# Patient Record
Sex: Male | Born: 1990 | Hispanic: Yes | Marital: Single | Smoking: Never smoker
Health system: Southern US, Community
[De-identification: ages and names within clinical notes are randomized; demographics above are authoritative.]

---

## 2012-12-04 ENCOUNTER — Emergency Department (HOSPITAL_COMMUNITY)
Admission: EM | Admit: 2012-12-04 | Discharge: 2012-12-05 | Disposition: A | Discharge status: Home / Self Care | Payer: Self-pay | Attending: Emergency Medicine | Admitting: Emergency Medicine

## 2012-12-04 ENCOUNTER — Encounter (HOSPITAL_COMMUNITY): Payer: Self-pay | Admitting: Emergency Medicine

## 2012-12-04 DIAGNOSIS — J111 Influenza due to unidentified influenza virus with other respiratory manifestations: Secondary | ICD-10-CM | POA: Insufficient documentation

## 2012-12-04 NOTE — ED Notes (Signed)
Patient presents with c/o fever, body aches  Able to drink and eat.

## 2012-12-05 MED ORDER — IBUPROFEN 800 MG PO TABS: 800.0000 mg | ORAL_TABLET | Freq: Three times a day (TID) | ORAL | Status: AC

## 2012-12-05 MED ORDER — IBUPROFEN 400 MG PO TABS
800.0000 mg | ORAL_TABLET | Freq: Once | ORAL | Status: AC
  Administered 2012-12-05: 800 mg via ORAL
  Filled 2012-12-05: qty 2

## 2012-12-05 MED ORDER — HYDROCOD POLST-CHLORPHEN POLST 10-8 MG/5ML PO LQCR: 5.0000 mL | Freq: Two times a day (BID) | ORAL | Status: AC

## 2012-12-05 NOTE — ED Provider Notes (Signed)
Medical screening examination/treatment/procedure(s) were performed by non-physician practitioner and as supervising physician I was immediately available for consultation/collaboration.    Sabina Beavers M Raji Glinski, MD 12/05/12 0623 

## 2012-12-05 NOTE — ED Provider Notes (Signed)
CSN: [id number]     Arrival date & time 12/04/12  2153 History   First MD Initiated Contact with Patient 12/04/12 2328     Chief Complaint  Patient presents with  . Fever  . Generalized Body Aches   HPI  History provided by the patient through a Spanish interpreter. Patient is a 22 year old Hispanic male with no significant PMH presenting with complaints of fever, body aches, chills, sore throat and eye pain. Symptoms have been present for the past one to 2 days. He began with general fatigue, fever and chills. Patient also began having worsening sore throat. He reports occasional cough symptoms. Today he has also had a pain and pressure around both eyes. Denies any redness, swelling or drainage. He does report some nasal congestion and rhinorrhea. Patient has not used any medications for treatment for his symptoms. He has slightly decreased appetite but did eat and drink normally today. Denies any associated vomiting or diarrhea. No recent travel or known sick contacts. No other aggravating or alleviating factors. No other associated symptoms.     History reviewed. No pertinent past medical history. History reviewed. No pertinent past surgical history. History reviewed. No pertinent family history. History  Substance Use Topics  . Smoking status: Never Smoker   . Smokeless tobacco: Never Used  . Alcohol Use: Yes     Comment: ocassionally    Review of Systems  Constitutional: Positive for fever, chills, diaphoresis, appetite change and fatigue.  HENT: Positive for rhinorrhea and sore throat. Negative for voice change.   Eyes: Positive for pain. Negative for photophobia, discharge, redness and visual disturbance.  Respiratory: Positive for cough. Negative for shortness of breath.   Cardiovascular: Negative for chest pain.  Gastrointestinal: Negative for nausea, vomiting, abdominal pain and diarrhea.  Skin: Negative for rash.  All other systems reviewed and are  negative.    Allergies  Review of patient's allergies indicates no known allergies.  Home Medications  No current outpatient prescriptions on file. BP 128/65  Pulse 92  Temp(Src) 100.2 F (37.9 C) (Oral)  Resp 18  Wt 128 lb 9 oz (58.316 kg)  SpO2 100% Physical Exam  Nursing note and vitals reviewed. Constitutional: He is oriented to person, place, and time. He appears well-developed and well-nourished. No distress.  HENT:  Head: Normocephalic.  Right Ear: Tympanic membrane normal.  Left Ear: Tympanic membrane normal.  Nose: Mucosal edema and rhinorrhea present. Right sinus exhibits maxillary sinus tenderness and frontal sinus tenderness. Left sinus exhibits maxillary sinus tenderness and frontal sinus tenderness.  Mouth/Throat: Oropharynx is clear and moist.  No significant erythema of the oropharynx. No lesions or petechiae. Normal tonsils. Uvula midline.  There is some pressure along the maxillary and frontal sinuses.  Eyes: Conjunctivae and EOM are normal. Pupils are equal, round, and reactive to light.  Neck: Normal range of motion. Neck supple.  No meningeal signs  Cardiovascular: Normal rate and regular rhythm.   Pulmonary/Chest: Effort normal and breath sounds normal. No respiratory distress. He has no wheezes. He has no rales.  Abdominal: Soft. There is no tenderness. There is no rebound and no guarding.  Musculoskeletal: Normal range of motion.  Neurological: He is alert and oriented to person, place, and time.  Skin: Skin is warm. No rash noted.  Psychiatric: He has a normal mood and affect. His behavior is normal.    ED Course  Procedures   DIAGNOSTIC STUDIES: Oxygen Saturation is 100% on room air.    COORDINATION OF CARE:  Nursing notes reviewed. Vital signs reviewed. Initial pt interview and examination performed.   2:08 AM-patient seen and evaluated. He is well-appearing in no acute distress. Does not appear severely ill or toxic. Does have slight fever  on triage. Symptoms are consistent with viral infection possible influenza. Patient did not receive flu vaccination. No recent travel. Discussed treatment plan with pt at bedside, which includes ibuprofen or Tylenol for fever and body aches as well as cough medication. Pt agrees with plan. Patient instructed to return in 5 days if symptoms not improved or to return sooner if symptoms worsen suddenly.      MDM   1. Influenza        Angus Seller, PA-C 12/05/12 [phone]

## 2013-11-13 ENCOUNTER — Emergency Department (HOSPITAL_COMMUNITY)
Admission: EM | Admit: 2013-11-13 | Discharge: 2013-11-13 | Disposition: A | Discharge status: Home / Self Care | Payer: Self-pay | Attending: Emergency Medicine | Admitting: Emergency Medicine

## 2013-11-13 ENCOUNTER — Encounter (HOSPITAL_COMMUNITY): Payer: Self-pay | Admitting: *Deleted

## 2013-11-13 DIAGNOSIS — L03011 Cellulitis of right finger: Secondary | ICD-10-CM

## 2013-11-13 DIAGNOSIS — Z791 Long term (current) use of non-steroidal anti-inflammatories (NSAID): Secondary | ICD-10-CM | POA: Insufficient documentation

## 2013-11-13 DIAGNOSIS — Z79899 Other long term (current) drug therapy: Secondary | ICD-10-CM | POA: Insufficient documentation

## 2013-11-13 DIAGNOSIS — L600 Ingrowing nail: Secondary | ICD-10-CM | POA: Insufficient documentation

## 2013-11-13 DIAGNOSIS — L03031 Cellulitis of right toe: Secondary | ICD-10-CM | POA: Insufficient documentation

## 2013-11-13 MED ORDER — HYDROCODONE-ACETAMINOPHEN 5-325 MG PO TABS: ORAL_TABLET | ORAL | Status: AC

## 2013-11-13 MED ORDER — LIDOCAINE HCL 2 % IJ SOLN
20.0000 mL | Freq: Once | INTRAMUSCULAR | Status: AC
  Administered 2013-11-13: 400 mg
  Filled 2013-11-13: qty 20

## 2013-11-13 MED ORDER — NAPROXEN 500 MG PO TABS: 500.0000 mg | ORAL_TABLET | Freq: Two times a day (BID) | ORAL | Status: AC

## 2013-11-13 MED ORDER — SULFAMETHOXAZOLE-TRIMETHOPRIM 800-160 MG PO TABS: 1.0000 | ORAL_TABLET | Freq: Two times a day (BID) | ORAL | Status: DC

## 2013-11-13 NOTE — ED Notes (Signed)
Pt in c/o infection to right great toe, swelling and redness noted, states it started as an ingrown toe nail

## 2013-11-13 NOTE — ED Provider Notes (Signed)
CSN: [id number]     Arrival date & time 11/13/13  2018 History   First MD Initiated Contact with Patient 11/13/13 2110     Chief Complaint  Patient presents with  . Wound Infection     (Consider location/radiation/quality/duration/timing/severity/associated sxs/prior Treatment) HPI Comments: Patient presents with complaint of swelling, drainage of pus, pain to the lateral aspect of his right great toenail for the past 2 weeks. No fever. Patient states that it hurts more to walk on the foot. He has been using peroxide but no other treatments prior to arrival.   The history is provided by the patient. A language interpreter was used.    History reviewed. No pertinent past medical history. History reviewed. No pertinent past surgical history. History reviewed. No pertinent family history. History  Substance Use Topics  . Smoking status: Never Smoker   . Smokeless tobacco: Never Used  . Alcohol Use: Yes     Comment: ocassionally    Review of Systems  Constitutional: Negative for fever.  Gastrointestinal: Negative for nausea and vomiting.  Skin: Positive for color change.       Positive for abscess  Hematological: Negative for adenopathy.   Allergies  Review of patient's allergies indicates no known allergies.  Home Medications   Prior to Admission medications   Medication Sig Start Date End Date Taking? Authorizing Provider  chlorpheniramine-HYDROcodone (TUSSIONEX PENNKINETIC ER) 10-8 MG/5ML LQCR Take 5 mLs by mouth every 12 (twelve) hours. Take 5 mLs by mouth every 12 (twelve) hours. 12/05/12   Phill MutterPeter S Dammen, PA-C  ibuprofen (ADVIL,MOTRIN) 800 MG tablet Take 1 tablet (800 mg total) by mouth 3 (three) times daily. 12/05/12   Peter S Dammen, PA-C   BP 122/66 mmHg  Pulse 74  Temp(Src) 98.2 F (36.8 C) (Oral)  Resp 16  Ht 5\' 8"  (1.727 m)  Wt 132 lb 9.6 oz (60.147 kg)  BMI 20.17 kg/m2  SpO2 98%   Physical Exam  Constitutional: He appears well-developed and  well-nourished.  HENT:  Head: Normocephalic and atraumatic.  Eyes: Conjunctivae are normal.  Neck: Normal range of motion. Neck supple.  Pulmonary/Chest: No respiratory distress.  Neurological: He is alert.  Skin: Skin is warm and dry.  There is swelling and pus draining from the right lateral nail fold with ingrown toenail and associated paronychia. There is no streaking onto the foot. Area is very tender to palpation and fluctuant.  Psychiatric: He has a normal mood and affect.  Nursing note and vitals reviewed.   ED Course  NAIL REMOVAL Date/Time: 11/13/2013 10:50 PM Performed by: Renne CriglerGEIPLE, Karlissa Aron Authorized by: Renne CriglerGEIPLE, Oriah Leinweber Consent: Verbal consent obtained. Risks and benefits: risks, benefits and alternatives were discussed Consent given by: patient Patient understanding: patient states understanding of the procedure being performed Patient consent: the patient's understanding of the procedure matches consent given Procedure consent: procedure consent matches procedure scheduled Required items: required blood products, implants, devices, and special equipment available Patient identity confirmed: verbally with patient and arm band Time out: Immediately prior to procedure a "time out" was called to verify the correct patient, procedure, equipment, support staff and site/side marked as required. Location: right foot Location details: right big toe Anesthesia: digital block Local anesthetic: lidocaine 1% without epinephrine Anesthetic total: 4 ml Patient sedated: no Preparation: skin prepped with alcohol and skin prepped with Betadine Amount removed: 1/4 Nail removed location: lateral. Wedge excision of skin of nail fold: no Nail bed sutured: no Nail matrix removed: partial Removed nail replaced and anchored: no  Dressing: antibiotic ointment and dressing applied Patient tolerance: Patient tolerated the procedure well with no immediate complications   (including critical  care time) Labs Review Labs Reviewed - No data to display  Imaging Review No results found.   EKG Interpretation None       Patient seen and examined. Procedure explained via interpreter phone. Patient informed of risks and benefits. He agrees to proceed.   Vital signs reviewed and are as follows: BP 117/61 mmHg  Pulse 61  Temp(Src) 98.2 F (36.8 C) (Oral)  Resp 20  Ht 5\' 8"  (1.727 m)  Wt 132 lb 9.6 oz (60.147 kg)  BMI 20.17 kg/m2  SpO2 100%  Digital block performed Technique: ring block Toe: Great right Area prepped with alcohol wipe and iodine Medication: 4mL of 1% lidocaine without epinephrine. Patient tolerated procedure well. Excellent anesthesia achieved.   Moderate amount of pus expressed upon removal of nail.   Patient asked to take antibiotics and pain medications as prescribed. He is encouraged to return to the emergency department in 48-72 hours for a wound recheck to ensure that the infection is improving. He is instructed to soak his foot in warm water for 15 minutes at least twice a day for the next 5 days. Work note given. Patient told to wear shoes that protect the area and to keep it clean.  Patient counseled on use of narcotic pain medications. Counseled not to combine these medications with others containing tylenol. Urged not to drink alcohol, drive, or perform any other activities that requires focus while taking these medications. The patient verbalizes understanding and agrees with the plan.   MDM   Final diagnoses:  Ingrown toenail  Paronychia, right   Patient with infected ingrown toenail. Infection was severe enough and nail was significantly ingrown enough to necessitate partial excision of nail. Patient tolerated procedure very well. Discussed appropriate wound care and return in 48 hours for recheck.         Renne CriglerJoshua Maya Scholer, PA-C 11/13/13 2355

## 2013-11-13 NOTE — Discharge Instructions (Signed)
Please read and follow all provided instructions.  Your diagnoses today include:  1. Ingrown toenail   2. Paronychia, right     Tests performed today include:  Vital signs. See below for your results today.   Medications prescribed:   Vicodin (hydrocodone/acetaminophen) - narcotic pain medication  DO NOT drive or perform any activities that require you to be awake and alert because this medicine can make you drowsy. BE VERY CAREFUL not to take multiple medicines containing Tylenol (also called acetaminophen). Doing so can lead to an overdose which can damage your liver and cause liver failure and possibly death.   Naproxen - anti-inflammatory pain medication  Do not exceed 500mg  naproxen every 12 hours, take with food  You have been prescribed an anti-inflammatory medication or NSAID. Take with food. Take smallest effective dose for the shortest duration needed for your pain. Stop taking if you experience stomach pain or vomiting.    Bactrim (trimethoprim/sulfamethoxazole) - antibiotic  You have been prescribed an antibiotic medicine: take the entire course of medicine even if you are feeling better. Stopping early can cause the antibiotic not to work.  Take any prescribed medications only as directed.   Home care instructions:   Follow any educational materials contained in this packet  Soak your foot in warm water twice a day for the next 5 days.   Follow-up instructions: Return to the Emergency Department in 48-72 hours for recheck.  Return instructions:  Return to the Emergency Department if you have:  Fever  Worsening symptoms  Worsening pain  Worsening swelling  Redness of the skin that moves away from the affected area, especially if it streaks away from the affected area   Any other emergent concerns   Your vital signs today were: BP 122/66 mmHg   Pulse 74   Temp(Src) 98.2 F (36.8 C) (Oral)   Resp 16   Ht 5\' 8"  (1.727 m)   Wt 132 lb 9.6 oz (60.147  kg)   BMI 20.17 kg/m2   SpO2 98% If your blood pressure (BP) was elevated above 135/85 this visit, please have this repeated by your doctor within one month. --------------

## 2013-11-13 NOTE — ED Notes (Signed)
Patient is alert and orientedx4.  Patient was explained discharge instructions and they understood them with no questions.  The patient's brother, Colin RheinGuadalupe Yanez is taking the patient home.

## 2016-10-31 ENCOUNTER — Encounter (HOSPITAL_COMMUNITY): Payer: Self-pay | Admitting: *Deleted

## 2016-10-31 ENCOUNTER — Emergency Department (HOSPITAL_COMMUNITY)
Admission: EM | Admit: 2016-10-31 | Discharge: 2016-10-31 | Disposition: A | Discharge status: Home / Self Care | Payer: Self-pay | Attending: Emergency Medicine | Admitting: Emergency Medicine

## 2016-10-31 DIAGNOSIS — Z791 Long term (current) use of non-steroidal anti-inflammatories (NSAID): Secondary | ICD-10-CM | POA: Insufficient documentation

## 2016-10-31 DIAGNOSIS — Z79899 Other long term (current) drug therapy: Secondary | ICD-10-CM | POA: Insufficient documentation

## 2016-10-31 DIAGNOSIS — L02415 Cutaneous abscess of right lower limb: Secondary | ICD-10-CM | POA: Insufficient documentation

## 2016-10-31 MED ORDER — SULFAMETHOXAZOLE-TRIMETHOPRIM 800-160 MG PO TABS: 1.0000 | ORAL_TABLET | Freq: Two times a day (BID) | ORAL | 0 refills | Status: AC

## 2016-10-31 MED ORDER — CEPHALEXIN 500 MG PO CAPS: 500.0000 mg | ORAL_CAPSULE | Freq: Four times a day (QID) | ORAL | 0 refills | Status: AC

## 2016-10-31 MED ORDER — LIDOCAINE-EPINEPHRINE (PF) 2 %-1:200000 IJ SOLN
10.0000 mL | Freq: Once | INTRAMUSCULAR | Status: AC
  Administered 2016-10-31: 10 mL via INTRADERMAL
  Filled 2016-10-31: qty 20

## 2016-10-31 NOTE — ED Provider Notes (Signed)
[facility]   CSN: [id number] Arrival date & time: 10/31/16  1732     History   Chief Complaint Chief Complaint  Patient presents with  . Abscess    HPI David Delacruz is a 26 y.o. male.  HPI David Delacruz is a 26 y.o. male presents to emergency department complaining of an abscess to the right lower leg. Patient states he noticed a bump on his leg 3 days ago. Since then the bump has gotten larger and more painful. He states now the pain radiates all the way from the ankle to his knee. He states he has a wound that is draining yellow drainage. It is tender to palpation. He reports he has been applying peroxide to the wound with no improvement. He denies any fever or chills. She denies any injuries. He is not sure if this was an injury at work, or if it's an insect bite. No history of the same.  Spanish interpreter was used via [employer] ipad  History reviewed. No pertinent past medical history.  There are no active problems to display for this patient.   History reviewed. No pertinent surgical history.     Home Medications    Prior to Admission medications   Medication Sig Start Date End Date Taking? Authorizing Provider  chlorpheniramine-HYDROcodone (TUSSIONEX PENNKINETIC ER) 10-8 MG/5ML LQCR Take 5 mLs by mouth every 12 (twelve) hours. Take 5 mLs by mouth every 12 (twelve) hours. 12/05/12   Ivonne Andrewammen, Peter, PA-C  HYDROcodone-acetaminophen (NORCO/VICODIN) 5-325 MG per tablet Take 1-2 tablets every 6 hours as needed for severe pain 11/13/13   Renne CriglerGeiple, Joshua, PA-C  ibuprofen (ADVIL,MOTRIN) 800 MG tablet Take 1 tablet (800 mg total) by mouth 3 (three) times daily. 12/05/12   Ivonne Andrewammen, Peter, PA-C  naproxen (NAPROSYN) 500 MG tablet Take 1 tablet (500 mg total) by mouth 2 (two) times daily. 11/13/13   Renne CriglerGeiple, Joshua, PA-C  sulfamethoxazole-trimethoprim (SEPTRA DS) 800-160 MG per tablet Take 1 tablet by mouth  every 12 (twelve) hours. 11/13/13   Renne CriglerGeiple, Joshua, PA-C    Family History No family history on file.  Social History Social History  Substance Use Topics  . Smoking status: Never Smoker  . Smokeless tobacco: Never Used  . Alcohol use Yes     Comment: ocassionally     Allergies   Patient has no known allergies.   Review of Systems Review of Systems  Constitutional: Negative for chills and fever.  Respiratory: Negative for cough, chest tightness and shortness of breath.   Cardiovascular: Negative for chest pain, palpitations and leg swelling.  Gastrointestinal: Negative for abdominal distention, abdominal pain, diarrhea, nausea and vomiting.  Musculoskeletal: Positive for myalgias. Negative for neck pain and neck stiffness.  Skin: Positive for color change and wound. Negative for rash.  Allergic/Immunologic: Negative for immunocompromised state.  Neurological: Negative for dizziness, weakness, light-headedness, numbness and headaches.     Physical Exam Updated Vital Signs BP (!) 153/87 (BP Location: Right Arm)   Pulse 88   Temp 98.4 F (36.9 C) (Oral)   Resp 18   Ht 5\' 8"  (1.727 m)   Wt 67.1 kg (148 lb)   SpO2 100%   BMI 22.50 kg/m   Physical Exam  Constitutional: He appears well-developed and well-nourished. No distress.  Eyes: Conjunctivae are normal.  Neck: Neck supple.  Cardiovascular: Normal rate.   Pulmonary/Chest: No respiratory distress.  Abdominal: He exhibits no distension.  Musculoskeletal:  Erythema to the  right anterior shin with mid central abscess measuring about 3 x 3 cm. There are central scabbing with small purulent drainage. Tender to palpation. Fluctuant. Normal ankle and foot with no swelling, erythema. Dorsal pedal pulses intact. Full range of motion of both ankle and knee joints.  Skin: Skin is warm and dry.  Nursing note and vitals reviewed.    ED Treatments / Results  Labs (all labs ordered are listed, but only abnormal results are  displayed) Labs Reviewed - No data to display  EKG  EKG Interpretation None       Radiology No results found.  Procedures .Marland KitchenIncision and Drainage Date/Time: 10/31/2016 7:15 PM Performed by: Jaynie Crumble Authorized by: Jaynie Crumble   Consent:    Consent obtained:  Verbal   Consent given by:  Patient   Risks discussed:  Bleeding, incomplete drainage and pain   Alternatives discussed:  No treatment Location:    Type:  Abscess   Location:  Lower extremity   Lower extremity location:  Leg   Leg location:  R lower leg Pre-procedure details:    Skin preparation:  Betadine Anesthesia (see MAR for exact dosages):    Anesthesia method:  Local infiltration   Local anesthetic:  Lidocaine 2% WITH epi Procedure type:    Complexity:  Simple Procedure details:    Incision types:  Single straight   Scalpel blade:  11   Wound management:  Probed and deloculated and irrigated with saline   Drainage:  Bloody and purulent   Drainage amount:  Moderate   Packing materials:  1/4 in gauze Post-procedure details:    Patient tolerance of procedure:  Tolerated well, no immediate complications   (including critical care time)  Medications Ordered in ED Medications  lidocaine-EPINEPHrine (XYLOCAINE W/EPI) 2 %-1:200000 (PF) injection 10 mL (not administered)     Initial Impression / Assessment and Plan / ED Course  I have reviewed the triage vital signs and the nursing notes.  Pertinent labs & imaging results that were available during my care of the patient were reviewed by me and considered in my medical decision making (see chart for details).     Pt with abscess to right lower leg. Incised and drained with no complications. Afebrile, non toxic. Warm compresses at home. Keflex, bactrim. Packing removal in 2 days. Follow up with pcp or return if worsening.   Vitals:   10/31/16 1738 10/31/16 1750  BP:  (!) 153/87  Pulse:  88  Resp:  18  Temp:  98.4 F (36.9 C)    TempSrc:  Oral  SpO2:  100%  Weight: 67.1 kg (148 lb)   Height: 5\' 8"  (1.727 m)      Final Clinical Impressions(s) / ED Diagnoses   Final diagnoses:  Abscess of right lower leg    New Prescriptions New Prescriptions   No medications on file     Jaynie Crumble, Cordelia Poche 10/31/16 1918    Shon Baton, MD 10/31/16 1931

## 2016-10-31 NOTE — Discharge Instructions (Signed)
Warm compresses several times a day. Ibuprofen/tylenol for pain. Take keflex and bactrim as prescribed for infection until all gone. Remove packing in two days after soaking in. Return if not improving or worsening.

## 2016-10-31 NOTE — ED Triage Notes (Addendum)
Pt states infected bug bite to RLE x 1 week.  He is unsure if something bit him or if he was "poked" by something while at work.

## 2018-03-13 ENCOUNTER — Emergency Department (HOSPITAL_COMMUNITY)
Admission: EM | Admit: 2018-03-13 | Discharge: 2018-03-13 | Disposition: A | Discharge status: Home / Self Care | Payer: Self-pay | Attending: Emergency Medicine | Admitting: Emergency Medicine

## 2018-03-13 ENCOUNTER — Other Ambulatory Visit: Payer: Self-pay

## 2018-03-13 ENCOUNTER — Encounter (HOSPITAL_COMMUNITY): Payer: Self-pay | Admitting: Emergency Medicine

## 2018-03-13 DIAGNOSIS — M79642 Pain in left hand: Secondary | ICD-10-CM | POA: Insufficient documentation

## 2018-03-13 DIAGNOSIS — S52502D Unspecified fracture of the lower end of left radius, subsequent encounter for closed fracture with routine healing: Secondary | ICD-10-CM | POA: Insufficient documentation

## 2018-03-13 DIAGNOSIS — W19XXXD Unspecified fall, subsequent encounter: Secondary | ICD-10-CM | POA: Insufficient documentation

## 2018-03-13 MED ORDER — KETOROLAC TROMETHAMINE 15 MG/ML IJ SOLN
15.0000 mg | Freq: Once | INTRAMUSCULAR | Status: DC
  Filled 2018-03-13: qty 1

## 2018-03-13 MED ORDER — KETOROLAC TROMETHAMINE 60 MG/2ML IM SOLN
15.0000 mg | Freq: Once | INTRAMUSCULAR | Status: AC
  Administered 2018-03-13: 15 mg via INTRAMUSCULAR
  Filled 2018-03-13: qty 2

## 2018-03-13 NOTE — ED Notes (Signed)
Patient verbalizes understanding of discharge instructions. Opportunity for questioning and answers were provided. Armband removed by staff, pt discharged from ED ambulatory.   

## 2018-03-13 NOTE — ED Triage Notes (Signed)
Pt. Stated, My hand is a different color. On Friday he was working and fell and hurt my left arm. And bandage it and said any problem to come here.

## 2018-03-13 NOTE — ED Provider Notes (Signed)
[facility] EMERGENCY DEPARTMENT Provider Note   CSN: [id number] Arrival date & time: 03/13/18  1716    History   Chief Complaint Chief Complaint  Patient presents with  . Hand Pain    HPI David Delacruz is a 28 y.o. male.     Patient is a 28 year old male with past medical history of distal radius fracture on 28 February of this year.  He was seen at [facility] and had the fracture splinted by orthopedics.  He was told to follow-up with orthopedics.  He comes in today concerned for compartment syndrome.  He reports that his left arm splint feels like it might be too tight.  He also notes that he does have purple discoloration of his left thumb.  His pain is otherwise been pretty much controlled with Tylenol and Motrin.     History reviewed. No pertinent past medical history.  There are no active problems to display for this patient.   History reviewed. No pertinent surgical history.      Home Medications    Prior to Admission medications   Medication Sig Start Date End Date Taking? Authorizing Provider  cephALEXin (KEFLEX) 500 MG capsule Take 1 capsule (500 mg total) by mouth 4 (four) times daily. 10/31/16   Kirichenko, Lemont Fillers, PA-C  chlorpheniramine-HYDROcodone (TUSSIONEX PENNKINETIC ER) 10-8 MG/5ML LQCR Take 5 mLs by mouth every 12 (twelve) hours. Take 5 mLs by mouth every 12 (twelve) hours. 12/05/12   Ivonne Andrew, PA-C  HYDROcodone-acetaminophen (NORCO/VICODIN) 5-325 MG per tablet Take 1-2 tablets every 6 hours as needed for severe pain 11/13/13   Renne Crigler, PA-C  ibuprofen (ADVIL,MOTRIN) 800 MG tablet Take 1 tablet (800 mg total) by mouth 3 (three) times daily. 12/05/12   Ivonne Andrew, PA-C  naproxen (NAPROSYN) 500 MG tablet Take 1 tablet (500 mg total) by mouth 2 (two) times daily. 11/13/13   Renne Crigler, PA-C    Family History No family history on file.  Social History Social History   Tobacco Use  . Smoking  status: Never Smoker  . Smokeless tobacco: Never Used  Substance Use Topics  . Alcohol use: Yes    Comment: ocassionally  . Drug use: No     Allergies   Patient has no known allergies.   Review of Systems Review of Systems  Constitutional: Negative for fever.  Musculoskeletal: Positive for arthralgias. Negative for back pain, gait problem, joint swelling, myalgias, neck pain and neck stiffness.  Skin: Positive for color change. Negative for pallor, rash and wound.  Neurological: Negative for dizziness and numbness.  Hematological: Does not bruise/bleed easily.     Physical Exam Updated Vital Signs BP 115/75 (BP Location: Right Arm)   Pulse 71   Temp 99 F (37.2 C) (Oral)   Resp 14   SpO2 100%   Physical Exam Vitals signs and nursing note reviewed.  Constitutional:      Appearance: Normal appearance.  HENT:     Head: Normocephalic.  Eyes:     Conjunctiva/sclera: Conjunctivae normal.  Pulmonary:     Effort: Pulmonary effort is normal.  Musculoskeletal:     Comments: The patient is in a splint with Ace wrap.  The patient has normal capillary reflex and normal sensation of all fingers.  He does have some ecchymosis to the lateral left thumb.  Patient states that this ecchymosis is what he was concerned about.  Reports that it feels kind of tight around his thumb.  He has full  range of motion of all of his fingers.  Skin:    General: Skin is warm and dry.     Capillary Refill: Capillary refill takes less than 2 seconds.  Neurological:     General: No focal deficit present.     Mental Status: He is alert.     Sensory: No sensory deficit.  Psychiatric:        Mood and Affect: Mood normal.      ED Treatments / Results  Labs (all labs ordered are listed, but only abnormal results are displayed) Labs Reviewed - No data to display  EKG None  Radiology No results found.  Procedures Procedures (including critical care time)  Medications Ordered in  ED Medications  ketorolac (TORADOL) injection 15 mg (15 mg Intramuscular Given 03/13/18 1936)     Initial Impression / Assessment and Plan / ED Course  I have reviewed the triage vital signs and the nursing notes.  Pertinent labs & imaging results that were available during my care of the patient were reviewed by me and considered in my medical decision making (see chart for details).  Clinical Course as of Mar 13 2123  Mon Mar 13, 2018  1925 No signs of compartment syndrome on exam.  The patient's elastic wrap was adjusted and patient had relief of his symptoms.  I gave him a shot of Toradol for his pain.  He has all the information for the orthopedic specialist who he is to follow-up with.  He had no further questions or complaints.  Discharge stable.  He was instructed on reasons to come back and other signs of compartment syndrome   [KM]    Clinical Course User Index [KM] Arlyn Dunning, PA-C       Based on review of vitals, medical screening exam, lab work and/or imaging, there does not appear to be an acute, emergent etiology for the patient's symptoms. Counseled pt on good return precautions and encouraged both PCP and ED follow-up as needed.   Clinical Impression: 1. Left hand pain     Disposition: Discharge   This note was prepared with assistance of Dragon voice recognition software. Occasional wrong-word or sound-a-like substitutions may have occurred due to the inherent limitations of voice recognition software.   Final Clinical Impressions(s) / ED Diagnoses   Final diagnoses:  Left hand pain    ED Discharge Orders    None       Jeral Pinch 03/13/18 2125    Gerhard Munch, MD 03/14/18 1956

## 2018-03-13 NOTE — ED Triage Notes (Signed)
Pt. Stated, I feel its too tight.

## 2018-03-13 NOTE — Discharge Instructions (Addendum)
Follow-up with orthopedics as you were referred at [facility].  Continue to take Tylenol and or Motrin for pain.  Return if you have any of the symptoms that we discussed about including cold fingers or numbness in your fingers.

## 2019-03-07 ENCOUNTER — Other Ambulatory Visit: Payer: Self-pay

## 2019-03-07 ENCOUNTER — Emergency Department (HOSPITAL_COMMUNITY): Payer: Self-pay

## 2019-03-07 ENCOUNTER — Encounter (HOSPITAL_COMMUNITY): Payer: Self-pay

## 2019-03-07 ENCOUNTER — Emergency Department (HOSPITAL_COMMUNITY)
Admission: EM | Admit: 2019-03-07 | Discharge: 2019-03-07 | Disposition: A | Discharge status: Home / Self Care | Payer: Self-pay | Attending: Emergency Medicine | Admitting: Emergency Medicine

## 2019-03-07 ENCOUNTER — Emergency Department (HOSPITAL_BASED_OUTPATIENT_CLINIC_OR_DEPARTMENT_OTHER): Payer: Self-pay

## 2019-03-07 DIAGNOSIS — M79609 Pain in unspecified limb: Secondary | ICD-10-CM

## 2019-03-07 DIAGNOSIS — M79652 Pain in left thigh: Secondary | ICD-10-CM | POA: Insufficient documentation

## 2019-03-07 DIAGNOSIS — R591 Generalized enlarged lymph nodes: Secondary | ICD-10-CM | POA: Insufficient documentation

## 2019-03-07 DIAGNOSIS — M79605 Pain in left leg: Secondary | ICD-10-CM

## 2019-03-07 LAB — CBC WITH DIFFERENTIAL/PLATELET
Abs Immature Granulocytes: 0.03 10*3/uL (ref 0.00–0.07)
Basophils Absolute: 0 10*3/uL (ref 0.0–0.1)
Basophils Relative: 0 %
Eosinophils Absolute: 0.1 10*3/uL (ref 0.0–0.5)
Eosinophils Relative: 1 %
HCT: 46 % (ref 39.0–52.0)
Hemoglobin: 15.5 g/dL (ref 13.0–17.0)
Immature Granulocytes: 0 %
Lymphocytes Relative: 19 %
Lymphs Abs: 2 10*3/uL (ref 0.7–4.0)
MCH: 29.9 pg (ref 26.0–34.0)
MCHC: 33.7 g/dL (ref 30.0–36.0)
MCV: 88.8 fL (ref 80.0–100.0)
Monocytes Absolute: 0.8 10*3/uL (ref 0.1–1.0)
Monocytes Relative: 8 %
Neutro Abs: 7.6 10*3/uL (ref 1.7–7.7)
Neutrophils Relative %: 72 %
Platelets: 247 10*3/uL (ref 150–400)
RBC: 5.18 MIL/uL (ref 4.22–5.81)
RDW: 12.5 % (ref 11.5–15.5)
WBC: 10.4 10*3/uL (ref 4.0–10.5)
nRBC: 0 % (ref 0.0–0.2)

## 2019-03-07 LAB — BASIC METABOLIC PANEL
Anion gap: 9 (ref 5–15)
BUN: 11 mg/dL (ref 6–20)
CO2: 25 mmol/L (ref 22–32)
Calcium: 9.1 mg/dL (ref 8.9–10.3)
Chloride: 105 mmol/L (ref 98–111)
Creatinine, Ser: 0.78 mg/dL (ref 0.61–1.24)
GFR calc Af Amer: >60 mL/min (ref >60)
GFR calc non Af Amer: >60 mL/min (ref >60)
Glucose, Bld: 101 mg/dL — ABNORMAL HIGH (ref 70–99)
Potassium: 4 mmol/L (ref 3.5–5.1)
Sodium: 139 mmol/L (ref 135–145)

## 2019-03-07 MED ORDER — IOHEXOL 350 MG/ML SOLN
100.0000 mL | Freq: Once | INTRAVENOUS | Status: AC | PRN
  Administered 2019-03-07: 17:00:00 100 mL via INTRAVENOUS

## 2019-03-07 MED ORDER — DICLOFENAC SODIUM 1 % EX GEL: 2.0000 g | Freq: Four times a day (QID) | CUTANEOUS | 0 refills | Status: AC

## 2019-03-07 NOTE — ED Notes (Signed)
Pt transported to CT ?

## 2019-03-07 NOTE — ED Notes (Signed)
Patient verbalizes understanding of discharge instructions. Opportunity for questioning and answers were provided. Armband removed by staff, pt discharged from ED.  

## 2019-03-07 NOTE — ED Triage Notes (Signed)
Spanish interpreter used for triage:  Pt reports pain to left thigh area, describes as a "ball". Area is not red but very tender to touch, no open wound or swelling.

## 2019-03-07 NOTE — Progress Notes (Signed)
Lower extremity venous has been completed.   Preliminary results in CV Proc.   Blanch Media 03/07/2019 11:57 AM

## 2019-03-07 NOTE — ED Notes (Signed)
Pt returned from CT °

## 2019-03-07 NOTE — Discharge Instructions (Addendum)
Toma ibuprofen 3 veces dada dia por dolor. Puede toma tylenol tambien si necesita.  [place] la crema por dolor.  Da [place] Dear cita con la clinica debajo si no esta emjorando Regresa a la sala de Associate Professor so tiene fiebre, si el piel es ronjo, o con nuevos sintomas.

## 2019-03-07 NOTE — ED Provider Notes (Signed)
[facility] EMERGENCY DEPARTMENT Provider Note   CSN: [id number] Arrival date & time: 03/07/19  1035     History Chief Complaint  Patient presents with   Leg Pain    David Delacruz is a 29 y.o. male presenting for evaluation of left leg pain.  Patient states the past 2 days, he has been having left anterior leg pain.  Pain is of the proximal thigh.  He describes it as feeling as a sharp ball which is constant, worse with palpation and movement.  He took Tylenol last night, which mild improved his symptoms.  He denies fall, trauma, or injury.  He denies increased activity.  He denies recent travel, surgeries, immobilization.  He denies hormone use.  He denies pain on the right side.  No fevers, chills, nausea, vomiting.  He denies history of similar.  Takes no medications daily.  HPI     History reviewed. No pertinent past medical history.  There are no problems to display for this patient.   History reviewed. No pertinent surgical history.     No family history on file.  Social History   Tobacco Use   Smoking status: Never Smoker   Smokeless tobacco: Never Used  Substance Use Topics   Alcohol use: Yes    Comment: ocassionally   Drug use: No    Home Medications Prior to Admission medications   Medication Sig Start Date End Date Taking? Authorizing Provider  acetaminophen (TYLENOL) 500 MG tablet Take 500 mg by mouth every 6 (six) hours as needed for mild pain.   Yes [provider]  Dextran 70-Hypromellose (ARTIFICIAL TEARS PF OP) Place 1 drop into both eyes daily.   Yes [provider]  cephALEXin (KEFLEX) 500 MG capsule Take 1 capsule (500 mg total) by mouth 4 (four) times daily. Patient not taking: Reported on 03/07/2019 10/31/16   Jeannett Senior, PA-C  chlorpheniramine-HYDROcodone (TUSSIONEX PENNKINETIC ER) 10-8 MG/5ML LQCR Take 5 mLs by mouth every 12 (twelve) hours. Take 5 mLs by mouth every 12 (twelve)  hours. Patient not taking: Reported on 03/07/2019 12/05/12   Hazel Sams, PA-C  diclofenac Sodium (VOLTAREN) 1 % GEL Apply 2 g topically 4 (four) times daily. 03/07/19   Frederic Tones, PA-C  HYDROcodone-acetaminophen (NORCO/VICODIN) 5-325 MG per tablet Take 1-2 tablets every 6 hours as needed for severe pain Patient not taking: Reported on 03/07/2019 11/13/13   Carlisle Cater, PA-C  ibuprofen (ADVIL,MOTRIN) 800 MG tablet Take 1 tablet (800 mg total) by mouth 3 (three) times daily. Patient not taking: Reported on 03/07/2019 12/05/12   Hazel Sams, PA-C  naproxen (NAPROSYN) 500 MG tablet Take 1 tablet (500 mg total) by mouth 2 (two) times daily. Patient not taking: Reported on 03/07/2019 11/13/13   Carlisle Cater, PA-C    Allergies    Patient has no known allergies.  Review of Systems   Review of Systems  Musculoskeletal: Positive for myalgias (L anterior proximal thigh).  Neurological: Negative for numbness.    Physical Exam Updated Vital Signs BP 119/79 (BP Location: Right Arm)    Pulse 85    Temp (!) 97.3 F (36.3 C) (Oral)    Resp 16    Ht 5' 6.14" (1.68 m)    Wt 73 kg    SpO2 100%    BMI 25.86 kg/m   Physical Exam Vitals and nursing note reviewed.  Constitutional:      General: He is not in acute distress.    Appearance: He is  well-developed.     Comments: Resting in the bed in no acute distress  HENT:     Head: Normocephalic and atraumatic.  Cardiovascular:     Rate and Rhythm: Normal rate and regular rhythm.     Pulses: Normal pulses.  Pulmonary:     Effort: Pulmonary effort is normal.     Breath sounds: Normal breath sounds.  Abdominal:     General: There is no distension.  Musculoskeletal:     Cervical back: Normal range of motion.       Legs:     Comments: Tenderness palpation of the anterior proximal left thigh.  No erythema, warmth, fluctuance, or induration.  No tenderness palpation over the bony hip/pelvis.  No tenderness palpation of the distal thigh or  calf.  Pedal pulse 2+ bilaterally.  Good distal sensation.  Full active range of motion of the toes, ankle, knees without difficulty.  Increased pain with flexion at the hip.  No pain of the right lower extremity.  Skin:    General: Skin is warm.     Capillary Refill: Capillary refill takes less than 2 seconds.     Findings: No rash.  Neurological:     Mental Status: He is alert and oriented to person, place, and time.     ED Results / Procedures / Treatments   Labs (all labs ordered are listed, but only abnormal results are displayed) Labs Reviewed  BASIC METABOLIC PANEL - Abnormal; Notable for the following components:      Result Value   Glucose, Bld 101 (*)    All other components within normal limits  CBC WITH DIFFERENTIAL/PLATELET    EKG None  Radiology CT ANGIO LOW EXTREM LEFT W &/OR WO CONTRAST  Result Date: 03/07/2019 CLINICAL DATA:  Left thigh pain. EXAM: CT ANGIOGRAPHY OF ABDOMINAL AORTA WITH ILIOFEMORAL RUNOFF TECHNIQUE: Multidetector CT imaging of the abdomen, pelvis and lower extremities was performed using the standard protocol during bolus administration of intravenous contrast. Multiplanar CT image reconstructions and MIPs were obtained to evaluate the vascular anatomy. CONTRAST:  OMNIPAQUE IOHEXOL 350 MG/ML SOLN COMPARISON:  None. FINDINGS: LEFT Lower Extremity Inflow: Common, internal and external iliac arteries are patent without evidence of aneurysm, dissection, vasculitis or significant stenosis. Outflow: Common, superficial and profunda femoral arteries and the popliteal artery are patent without evidence of aneurysm, dissection, vasculitis or significant stenosis. Runoff: There is likely a 3 vessel runoff, however there is suboptimal opacification of the tibial vasculature at the level of the ankle. This is felt to be secondary to contrast bolus timing as the vasculature at the level of the right ankle appears similar. Veins: No obvious venous abnormality  within the limitations of this arterial phase study. Review of the MIP images confirms the above findings. NON-VASCULAR The visualized portions of the patient's abdomen and pelvis are unremarkable. Bladder is unremarkable. There is no significant free fluid the patient's pelvis. There is an enlarged right inguinal lymph node measuring approximately 1.5 cm in the short axis (axial series 7, image 95). This lymph node appears to demonstrate a persistent normal fatty hilum by CT standards. IMPRESSION: 1. No acute vascular abnormality detected. There is likely a three vessel runoff to the left ankle, however there is suboptimal opacification of the distal AT, PT, and peroneal arteries. This is felt to be secondary to suboptimal contrast bolus timing. 2. Enlarged left inguinal lymph node of unknown clinical significance. Correlation for lower extremity infectious process is recommended. No such process is clearly  visualized on this CT. Electronically Signed   By: Katherine Mantle M.D.   On: 03/07/2019 18:17   VAS [place] LOWER EXTREMITY VENOUS (DVT) (MC and WL 7a-7p)  Result Date: 03/07/2019  Lower Venous DVTStudy Indications: Pain.  Comparison Study: no prior Performing Technologist: Blanch Media RVS  Examination Guidelines: A complete evaluation includes B-mode imaging, spectral Doppler, color Doppler, and power Doppler as needed of all accessible portions of each vessel. Bilateral testing is considered an integral part of a complete examination. Limited examinations for reoccurring indications may be performed as noted. The reflux portion of the exam is performed with the patient in reverse Trendelenburg.  +-----+---------------+---------+-----------+----------+--------------+  RIGHT Compressibility Phasicity Spontaneity Properties Thrombus Aging  +-----+---------------+---------+-----------+----------+--------------+  CFV   Full            Yes       Yes                                     +-----+---------------+---------+-----------+----------+--------------+   +---------+---------------+---------+-----------+----------+--------------+  LEFT      Compressibility Phasicity Spontaneity Properties Thrombus Aging  +---------+---------------+---------+-----------+----------+--------------+  CFV       Full            Yes       Yes                                    +---------+---------------+---------+-----------+----------+--------------+  SFJ       Full                                                             +---------+---------------+---------+-----------+----------+--------------+  FV Prox   Full                                                             +---------+---------------+---------+-----------+----------+--------------+  FV Mid    Full                                                             +---------+---------------+---------+-----------+----------+--------------+  FV Distal Full                                                             +---------+---------------+---------+-----------+----------+--------------+  PFV       Full                                                             +---------+---------------+---------+-----------+----------+--------------+  POP       Full            Yes       Yes                                    +---------+---------------+---------+-----------+----------+--------------+  PTV       Full                                                             +---------+---------------+---------+-----------+----------+--------------+  PERO      Full                                                             +---------+---------------+---------+-----------+----------+--------------+     Summary: RIGHT: - No evidence of common femoral vein obstruction.  LEFT: - There is no evidence of deep vein thrombosis in the lower extremity.  - No cystic structure found in the popliteal fossa. - Vascularized structure meauring 3.23x1.82cm. Etiology unknow.  *See table(s)  above for measurements and observations.    Preliminary     Procedures Procedures (including critical care time)  Medications Ordered in ED Medications  iohexol (OMNIPAQUE) 350 MG/ML injection 100 mL (100 mLs Intravenous Contrast Given 03/07/19 1723)    ED Course  I have reviewed the triage vital signs and the nursing notes.  Pertinent labs & imaging results that were available during my care of the patient were reviewed by me and considered in my medical decision making (see chart for details).    MDM Rules/Calculators/A&P                      Presenting for evaluation of anterior left proximal thigh pain.  Physical exam shows patient appears nontoxic.  Exam is not consistent with infection.  No bony tenderness, doubt fracture, AVN, or bony lesion.  Patient without risk factors for DVT, however with severe unilateral leg pain near the groin, will obtain ultrasound to rule out clot.  If negative, likely MSK and can be treated symptomatically.  Ultrasound shows abnormal area vascularization.  With attending, will obtain labs and CTA for further evaluation.  Labs interpreted by me, overall reassuring.  No leukocytosis (consistent with exam that shows no infection).   CTA without vascular abnormality.  Does show an enlarged lymph node in the inguinal region.  There are no signs of infection on exam or with patient's blood work.  Pain is mostly in the musculature.  As such, low suspicion for infectious cause for enlarged lymph node.  Discussed findings with patient.  Discussed treatment with anti-inflammatories and close monitoring.  Discussed importance of follow-up with primary care if symptoms are not improving.  At this time, patient appears safe for discharge.  Return precautions given.  Patient states he understands and agrees to plan.  Final Clinical Impression(s) / ED Diagnoses Final diagnoses:  Anterior leg pain, left  Lymphadenopathy    Rx / DC Orders ED Discharge Orders          Ordered  diclofenac Sodium (VOLTAREN) 1 % GEL  4 times daily     03/07/19 1828           Alveria Apley, PA-C 03/07/19 1840    Benjiman Core, MD 03/08/19 [phone]

## 2019-03-08 ENCOUNTER — Other Ambulatory Visit: Payer: Self-pay

## 2019-03-08 ENCOUNTER — Ambulatory Visit: Payer: Self-pay | Attending: Physical Medicine and Rehabilitation

## 2019-03-08 ENCOUNTER — Telehealth: Payer: Self-pay

## 2019-03-08 DIAGNOSIS — R42 Dizziness and giddiness: Secondary | ICD-10-CM | POA: Insufficient documentation

## 2019-03-08 DIAGNOSIS — R2689 Other abnormalities of gait and mobility: Secondary | ICD-10-CM | POA: Insufficient documentation

## 2019-03-08 DIAGNOSIS — H8112 Benign paroxysmal vertigo, left ear: Secondary | ICD-10-CM | POA: Insufficient documentation

## 2019-03-08 NOTE — Therapy (Signed)
[facility] [address] [place], [place], [address] Phone: [phone]   Fax:  [phone]  Physical Therapy Evaluation  Patient Details  Name: David Delacruz MRN: [id number] Date of Birth: 10-Feb-1990 Referring Provider (PT): Dr. Hughie Closs   Encounter Date: 03/08/2019  PT End of Session - 03/08/19 1812    Visit Number  1    Number of Visits  9    Date for PT Re-Evaluation  05/07/19    Authorization Type  Self Pay-per pt he is submitting visits to his attorney    PT Start Time  1702    PT Stop Time  1805    PT Time Calculation (min)  63 min    Equipment Utilized During Treatment  Other (comment)   S prn   Activity Tolerance  Patient tolerated treatment well    Behavior During Therapy  [facility] for tasks assessed/performed       History reviewed. No pertinent past medical history.  History reviewed. No pertinent surgical history.  There were no vitals filed for this visit.   Subjective Assessment - 03/08/19 1712    Subjective  Pt presenting to OPPT neuro s/p 03/10/18 fall from ladder (20-30' in air) resulting in L radius fx and concussion. Pt reported HAs are still bad and he's having memory issues and vision issues. He feels pressure in his eyes. Reporting photo sensitivity and reading incr. pain. Pt also experiencing dizziness and describes it as spinning.  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 Dizziness lasts < 1 minute but he feels woozy for about 5 minutes afterwards. At worst: 5/10, at best: 0/10. No dizziness currently. Pt stated he also has blurry vision, he saw a doctor in [place] but the notes states they recommend pt follow up with optometrist. Pt denied tinnitus or severe pain. PT asked about ED visit on 03/07/19 for LLE pain-testing negative for DVT.    Patient is accompained by:  Interpreter   Natalia [phone]   Pertinent History  L proximal  radius fracture s/p fall from ladder on 03/10/18    Patient Stated Goals  Get better.    Currently in Pain?  No/denies         [facility] PT Assessment - 03/08/19 1726      Assessment   Medical Diagnosis  Concussion    Referring Provider (PT)  Dr. Hughie Closs    Onset Date/Surgical Date  03/10/18    Hand Dominance  Right    Next MD Visit  In about two months but pt is unsure    Prior Therapy  none      Precautions   Precautions  Fall      Restrictions   Weight Bearing Restrictions  No      Balance Screen   Has the patient fallen in the past 6 months  No    Has the patient had a decrease in activity level because of a fear of falling?   Yes   fear of falling off roof at job   Is the patient reluctant to leave their home because of a fear of falling?   No      Home Environment   Living Environment  Private residence    Living Arrangements  Spouse/significant other;Children   five and six year old children   Available Help at Discharge  Family;Available 24 hours/day    Type of Home  House  Home Access  Stairs to enter    Entrance Stairs-Number of Steps  2    Entrance Stairs-Rails  None    Home Layout  One level    Home Equipment  None      Prior Function   Level of Independence  Independent    Vocation  Full time employment   doesn't have set schedule-weather dependent   Advertising account executive (ladder, laying shingles, walking on roof,)    Leisure  Spend time with family      Cognition   Overall Cognitive Status  Impaired/Different from baseline    Area of Impairment  Memory    Memory  Decreased short-term memory    Memory Comments  pt reports short-term memory is impacted after fall      Ambulation/Gait   Ambulation/Gait  Yes    Ambulation Distance (Feet)  150 Feet    Assistive device  None    Ambulation Surface  Level;Indoor    Gait velocity  4.52ft/sec.            Vestibular Assessment - 03/08/19 1740      Symptom Behavior   Type of Dizziness    Spinning    Frequency of Dizziness  Daily    Duration of Dizziness  <1 minute    Symptom Nature  Positional    Aggravating Factors  Comment   worse when standing or on roof for job   Relieving Factors  Rest    Progression of Symptoms  No change since onset    History of similar episodes  None prior to accident      Oculomotor Exam   Oculomotor Alignment  Normal    Spontaneous  Absent    Gaze-induced   Absent    Head shaking Horizontal  Absent    Head Shaking Vertical  Absent    Smooth Pursuits  Intact    Saccades  Intact    Comment  Convergence: reported diplopia within normal range of 1"      Vestibulo-Ocular Reflex   VOR 1 Head Only (x 1 viewing)  Dizziness reported.      Positional Testing   Dix-Hallpike  Dix-Hallpike Right;Dix-Hallpike Left    Horizontal Canal Testing  Horizontal Canal Right;Horizontal Canal Left      Dix-Hallpike Right   Dix-Hallpike Right Duration  None, and then pt reported incr. pressure behind eyes with maybe some dizziness but he was not sure    Dix-Hallpike Right Symptoms  No nystagmus      Dix-Hallpike Left   Dix-Hallpike Left Duration  <30 sec. with 8/10 dizziness.    Dix-Hallpike Left Symptoms  Upbeat, left rotatory nystagmus      Horizontal Canal Right   Horizontal Canal Right Duration  0    Horizontal Canal Right Symptoms  Normal      Horizontal Canal Left   Horizontal Canal Left Duration  0    Horizontal Canal Left Symptoms  Normal      Cognition   Cognition Orientation Level  Oriented x 4      Positional Sensitivities   Up from Right Hallpike  Lightheadedness    Up from Left Hallpike  Mild dizziness          Objective measurements completed on examination: See above findings.      [facility] Adult PT Treatment/Exercise - 03/08/19 1726      Ambulation/Gait   Ambulation/Gait Assistance  7: Independent;5: Supervision    Ambulation/Gait Assistance Details  S to ensure  safety. Cues to improve reciprocal arm swing.  Noted to amb.  in guarded manner.     Gait Pattern  Step-through pattern;Decreased arm swing - left;Decreased arm swing - right      Vestibular Treatment/Exercise - 03/08/19 1809      Vestibular Treatment/Exercise   Vestibular Treatment Provided  Canalith Repositioning    Canalith Repositioning  Epley Manuever Left       EPLEY MANUEVER LEFT   Number of Reps   1    Overall Response   --   did not reassess 2/2 time constraints     RESPONSE DETAILS LEFT  Pt able to amb. after testing and treatment, reporting mild dizziness.            PT Education - 03/08/19 1810    Education Details  PT educated pt on PT duration, frequency, and POC. PT educated pt on BPPV and exam findings.    Person(s) Educated  Patient;Other (comment)   interpreter present   Methods  Explanation    Comprehension  Verbalized understanding       PT Short Term Goals - 03/08/19 1820      PT SHORT TERM GOAL #1   Title  Pt will be IND with HEP to improve balance and dizziness. TARGET DATE FOR ALL STGS: 04/05/19    Time  4    Period  Weeks    Status  New      PT SHORT TERM GOAL #2   Title  Perform SOT and FGA and write goals as indicated.    Time  4    Period  Weeks    Status  New      PT SHORT TERM GOAL #3   Title  Pt will report dizziness at worst </=4/10 to improve QOL and safety during work.    Baseline  5/10-however, pt reported 8/10 during L Dix-Hallpike    Time  4    Period  Weeks    Status  New        PT Long Term Goals - 03/08/19 1822      PT LONG TERM GOAL #1   Title  Pt will report no dizziness during all positional testing. TARGET DATE FOR ALL LTGS:05/03/19    Time  8    Period  Weeks    Status  New      PT LONG TERM GOAL #2   Title  Pt will amb. 1000' over uneven terrain IND, while performing head turns/nods with 0/10 dizziness to improve functional mobility.    Time  8    Period  Weeks    Status  New      PT LONG TERM GOAL #3   Title  Pt will report dizziness </=2/10 at worst in order to  perform job and take care of his kids safely.    Time  8    Period  Weeks    Status  New             Plan - 03/08/19 1812    Clinical Impression Statement  Pt is a pleasant 28y/o male presenting to OPPT neuro s/p concussion, after falling 20-30' from ladder on 03/11/19. Pt's PMH: L proximal radius fx sustained after fall. The following impairments were noted upon exam: dizziness, impaired balance when dizzy per pt report, gait deviations during dynamic gait activities (turns). Pt's gait speed was WNL. Pt reported concordant dizziness during VOR and L Dix-Hallpike testing indicating dizziness is multi-factorial. S/s consistent with  L pBPPV, therefore, PT performed L Epley manuever. PT will formally assess balance next session as limited 2/2 time constraints. Pt would benefit from skilled PT to improve safety and dizziness during functional mobility.    Personal Factors and Comorbidities  Social Background;Profession;Time since onset of injury/illness/exacerbation    Examination-Activity Limitations  Bend;Squat;Caring for Others;Carry;Locomotion Level;Other    Examination-Participation Restrictions  Driving;Interpersonal Relationship    Stability/Clinical Decision Making  Evolving/Moderate complexity    Clinical Decision Making  Moderate    Rehab Potential  Good    PT Frequency  1x / week    PT Duration  8 weeks    PT Treatment/Interventions  ADLs/Self Care Home Management;Biofeedback;Canalith Repostioning;Balance training;Therapeutic exercise;Manual techniques;Therapeutic activities;Functional mobility training;Stair training;Gait training;DME Instruction;Patient/family education;Cognitive remediation;Neuromuscular re-education;Vestibular    PT Next Visit Plan  Perform SOT and FGA and write goals as indicated. Provide pt with x1 viewing HEP and reassess for L pBPPV and treat as indicated.    Recommended Other Services  Speech eval for memory deficits, encouraged pt to see optometrist per MD  instructions and that he may require referral to neuro ophthalmologist       Patient will benefit from skilled therapeutic intervention in order to improve the following deficits and impairments:  Abnormal gait, Dizziness, Impaired vision/preception, Decreased mobility, Decreased balance  Visit Diagnosis: Dizziness and giddiness - Plan: PT plan of care cert/re-cert  BPPV (benign paroxysmal positional vertigo), left - Plan: PT plan of care cert/re-cert  Other abnormalities of gait and mobility - Plan: PT plan of care cert/re-cert     Problem List There are no problems to display for this patient.   David Delacruz L 03/08/2019, 6:25 PM   Zerita Boers, PT,DPT 03/08/19 6:25 PM Phone: [phone] Fax: [phone]    [facility] [address] [place], [place], [address] Phone: [phone]   Fax:  [phone]  Name: David Delacruz MRN: [id number] Date of Birth: 1990-05-02

## 2019-03-08 NOTE — Telephone Encounter (Signed)
Dr. Hughie Closs,  I saw David Delacruz for his concussion today and feel that he would also benefit from a speech therapy eval to address his memory deficits. If you agree, please send [place] an order for a speech consult.  Thank you, Zerita Boers, PT,DPT 03/08/19 6:27 PM Phone: [phone] Fax: [phone]

## 2019-03-15 ENCOUNTER — Other Ambulatory Visit: Payer: Self-pay

## 2019-03-15 ENCOUNTER — Ambulatory Visit: Payer: Self-pay | Attending: Physical Medicine and Rehabilitation

## 2019-03-15 DIAGNOSIS — R42 Dizziness and giddiness: Secondary | ICD-10-CM | POA: Insufficient documentation

## 2019-03-15 DIAGNOSIS — R2689 Other abnormalities of gait and mobility: Secondary | ICD-10-CM | POA: Insufficient documentation

## 2019-03-15 NOTE — Therapy (Addendum)
[facility] [address] [place], [place], [address] Phone: [phone]   Fax:  [phone]  Physical Therapy Treatment  Patient Details  Name: David Delacruz MRN: [id number] Date of Birth: 07-10-1990 Referring Provider (PT): Dr. Joice Lofts   Encounter Date: 03/15/2019  PT End of Session - 03/15/19 1858    Visit Number  2    Number of Visits  9    Date for PT Re-Evaluation  05/07/19    Authorization Type  Self Pay-per pt he is submitting visits to his attorney    PT Start Time  1704    PT Stop Time  1745    PT Time Calculation (min)  41 min    Equipment Utilized During Treatment  Other (comment)   min guard to S to ensure safety   Activity Tolerance  Patient tolerated treatment well    Behavior During Therapy  [facility] for tasks assessed/performed       History reviewed. No pertinent past medical history.  History reviewed. No pertinent surgical history.  There were no vitals filed for this visit.  Subjective Assessment - 03/15/19 1709    Subjective  Pt denied changes or falls since last visit.    Patient is accompained by:  Interpreter   Horacio : #[id number]   Pertinent History  L proximal radius fracture s/p fall from ladder on 03/10/18    Currently in Pain?  Yes    Pain Score  2     Pain Location  Eye    Pain Orientation  Left;Right    Pain Descriptors / Indicators  Pressure    Pain Type  Chronic pain    Pain Onset  More than a month ago    Aggravating Factors   working all day    Pain Relieving Factors  rest, calming environment         [facility] PT Assessment - 03/15/19 1712      Functional Gait  Assessment   Gait assessed   Yes    Gait Level Surface  Walks 20 ft in less than 5.5 sec, no assistive devices, good speed, no evidence for imbalance, normal gait pattern, deviates no more than 6 in outside of the 12 in walkway width.    Change in Gait Speed  Able to smoothly change walking speed without loss  of balance or gait deviation. Deviate no more than 6 in outside of the 12 in walkway width.    Gait with Horizontal Head Turns  Performs head turns smoothly with slight change in gait velocity (eg, minor disruption to smooth gait path), deviates 6-10 in outside 12 in walkway width, or uses an assistive device.    Gait with Vertical Head Turns  Performs head turns with no change in gait. Deviates no more than 6 in outside 12 in walkway width.    Gait and Pivot Turn  Pivot turns safely within 3 sec and stops quickly with no loss of balance.    Step Over Obstacle  Is able to step over 2 stacked shoe boxes taped together (9 in total height) without changing gait speed. No evidence of imbalance.    Gait with Narrow Base of Support  Is able to ambulate for 10 steps heel to toe with no staggering.    Gait with Eyes Closed  Walks 20 ft, no assistive devices, good speed, no evidence of imbalance, normal gait pattern, deviates no more than 6 in outside 12 in walkway width. Ambulates  20 ft in less than 7 sec.    Ambulating Backwards  Walks 20 ft, no assistive devices, good speed, no evidence for imbalance, normal gait    Steps  Alternating feet, no rail.    Total Score  29    FGA comment:  29/30: WNL-with pt reporting some incr. in eye pressure during head turns/nods                        Balance Exercises - 03/15/19 1854      Balance Exercises: Standing   Standing Eyes Opened  Foam/compliant surface;Narrow base of support (BOS);2 reps;30 secs;Head turns    Standing Eyes Closed  Narrow base of support (BOS);Head turns;Foam/compliant surface;2 reps;30 secs    Tandem Stance  Eyes open;Eyes closed;3 reps   per LE in front (with and without head turns)   Other Standing Exercises  Performed in // bars with min guard to S to ensure safety. Cues and demo for technique. Pt reported 2/10 dizziness during tandem activities and up to 5/10 during compliant surface activities. Provided pt with HEP.         PT Education - 03/15/19 1857    Education Details  PT discussed outcome measure results and provided pt with HEP to improve balance and decr. dizziness. Rehab tech, Denny Peon, stayed after appt. to help translate HEP into spanich with interpreter.    Person(s) Educated  Patient    Methods  Explanation;Demonstration;Verbal cues;Handout    Comprehension  Returned demonstration;Verbalized understanding       PT Short Term Goals - 03/08/19 1820      PT SHORT TERM GOAL #1   Title  Pt will be IND with HEP to improve balance and dizziness. TARGET DATE FOR ALL STGS: 04/05/19    Time  4    Period  Weeks    Status  New      PT SHORT TERM GOAL #2   Title  Perform SOT and FGA and write goals as indicated.    Time  4    Period  Weeks    Status  New      PT SHORT TERM GOAL #3   Title  Pt will report dizziness at worst </=4/10 to improve QOL and safety during work.    Baseline  5/10-however, pt reported 8/10 during L Dix-Hallpike    Time  4    Period  Weeks    Status  New        PT Long Term Goals - 03/08/19 1822      PT LONG TERM GOAL #1   Title  Pt will report no dizziness during all positional testing. TARGET DATE FOR ALL LTGS:05/03/19    Time  8    Period  Weeks    Status  New      PT LONG TERM GOAL #2   Title  Pt will amb. 1000' over uneven terrain IND, while performing head turns/nods with 0/10 dizziness to improve functional mobility.    Time  8    Period  Weeks    Status  New      PT LONG TERM GOAL #3   Title  Pt will report dizziness </=2/10 at worst in order to perform job and take care of his kids safely.    Time  8    Period  Weeks    Status  New            Plan - 03/15/19 1859  Clinical Impression Statement  Pt's FGA score was WNL but pt did experience incr. in dizziness and pressure behind eyes during testing, especially during gait with head turns/nods. Pt experienced incr. dizziness and postural sway/LOB during balance activities which required  incr. vestibular input-with narrow BOS and head turns/nods, especially over compliant surfaces. PT did not reassess for L BPPV today 2/2 time constraints but will perform re-assessment and SOT next session. Pt would continue to benefit from skilled therapy to improve dizziness and safety during functional mobility.    Personal Factors and Comorbidities  Social Background;Profession;Time since onset of injury/illness/exacerbation    Examination-Activity Limitations  Bend;Squat;Caring for Others;Carry;Locomotion Level;Other    Examination-Participation Restrictions  Driving;Interpersonal Relationship    Stability/Clinical Decision Making  Evolving/Moderate complexity    Rehab Potential  Good    PT Frequency  1x / week    PT Duration  8 weeks    PT Treatment/Interventions  ADLs/Self Care Home Management;Biofeedback;Canalith Repostioning;Balance training;Therapeutic exercise;Manual techniques;Therapeutic activities;Functional mobility training;Stair training;Gait training;DME Instruction;Patient/family education;Cognitive remediation;Neuromuscular re-education;Vestibular    PT Next Visit Plan  Perform SOT.  Provide pt with x1 viewing HEP and reassess for L pBPPV and treat as indicated. Speech referral-have we heard back yet?    Consulted and Agree with Plan of Care  Patient       Patient will benefit from skilled therapeutic intervention in order to improve the following deficits and impairments:  Abnormal gait, Dizziness, Impaired vision/preception, Decreased mobility, Decreased balance  Visit Diagnosis: Dizziness and giddiness  Other abnormalities of gait and mobility     Problem List There are no problems to display for this patient.   Tiffiney Sparrow L 03/15/2019, 7:10 PM   Zerita Boers, PT,DPT 03/15/19 7:10 PM Phone: [phone] Fax: [phone]    [facility] Out[facility] [address] [place], [place], [address] Phone:  [phone]   Fax:  [phone]  Name: David Delacruz MRN: [id number] Date of Birth: 09/03/1990

## 2019-03-15 NOTE — Patient Instructions (Signed)
Tandem Stance    Con el pie derecho al frente del izquierdo, y el taln tocando los dedos del otro pie "pies alineados". Prese sobre el [place] del Pie con ambos pies. Mantenga el equilibrio en esta posicin durante _30__ segundos. Realice el ejercicio con el pie izquierdo adelante del derecho.  Look right and left 10 times and up and down 10 times.   Copyright  VHI. All rights reserved.   Feet Together (Compliant Surface) Head Motion - Eyes Closed    Stand on compliant surface: ___pillow or cushion_____ with feet together. Close eyes and move head slowly, up and down 10 times and side to side 10 times. Repeat __3__ times per session. Do __1__ sessions per day.  Copyright  VHI. All rights reserved.

## 2019-03-22 ENCOUNTER — Ambulatory Visit: Payer: Self-pay

## 2019-03-29 ENCOUNTER — Ambulatory Visit: Payer: Self-pay | Admitting: Physical Therapy

## 2019-04-05 ENCOUNTER — Ambulatory Visit: Payer: Self-pay | Admitting: Physical Therapy

## 2019-04-19 ENCOUNTER — Ambulatory Visit: Payer: Self-pay | Attending: Physical Medicine and Rehabilitation | Admitting: Physical Therapy

## 2019-04-26 ENCOUNTER — Encounter: Payer: Self-pay | Admitting: Physical Therapy

## 2019-05-03 ENCOUNTER — Encounter: Payer: Self-pay | Admitting: Physical Therapy

## 2019-12-29 ENCOUNTER — Emergency Department (HOSPITAL_COMMUNITY): Payer: Self-pay

## 2019-12-29 ENCOUNTER — Emergency Department (HOSPITAL_COMMUNITY)
Admission: EM | Admit: 2019-12-29 | Discharge: 2019-12-29 | Disposition: A | Discharge status: Home / Self Care | Payer: Self-pay | Attending: Emergency Medicine | Admitting: Emergency Medicine

## 2019-12-29 ENCOUNTER — Other Ambulatory Visit: Payer: Self-pay

## 2019-12-29 DIAGNOSIS — S61211A Laceration without foreign body of left index finger without damage to nail, initial encounter: Secondary | ICD-10-CM | POA: Insufficient documentation

## 2019-12-29 DIAGNOSIS — S81812A Laceration without foreign body, left lower leg, initial encounter: Secondary | ICD-10-CM | POA: Insufficient documentation

## 2019-12-29 DIAGNOSIS — Z23 Encounter for immunization: Secondary | ICD-10-CM | POA: Insufficient documentation

## 2019-12-29 DIAGNOSIS — W11XXXA Fall on and from ladder, initial encounter: Secondary | ICD-10-CM | POA: Insufficient documentation

## 2019-12-29 LAB — CBC WITH DIFFERENTIAL/PLATELET
Abs Immature Granulocytes: 0.02 10*3/uL (ref 0.00–0.07)
Basophils Absolute: 0 10*3/uL (ref 0.0–0.1)
Basophils Relative: 0 %
Eosinophils Absolute: 0.1 10*3/uL (ref 0.0–0.5)
Eosinophils Relative: 1 %
HCT: 45.1 % (ref 39.0–52.0)
Hemoglobin: 16.4 g/dL (ref 13.0–17.0)
Immature Granulocytes: 0 %
Lymphocytes Relative: 33 %
Lymphs Abs: 2.4 10*3/uL (ref 0.7–4.0)
MCH: 30.7 pg (ref 26.0–34.0)
MCHC: 36.4 g/dL — ABNORMAL HIGH (ref 30.0–36.0)
MCV: 84.5 fL (ref 80.0–100.0)
Monocytes Absolute: 0.7 10*3/uL (ref 0.1–1.0)
Monocytes Relative: 9 %
Neutro Abs: 4.3 10*3/uL (ref 1.7–7.7)
Neutrophils Relative %: 57 %
Platelets: 264 10*3/uL (ref 150–400)
RBC: 5.34 MIL/uL (ref 4.22–5.81)
RDW: 12.1 % (ref 11.5–15.5)
WBC: 7.5 10*3/uL (ref 4.0–10.5)
nRBC: 0 % (ref 0.0–0.2)

## 2019-12-29 LAB — BASIC METABOLIC PANEL
Anion gap: 10 (ref 5–15)
BUN: 18 mg/dL (ref 6–20)
CO2: 25 mmol/L (ref 22–32)
Calcium: 9.3 mg/dL (ref 8.9–10.3)
Chloride: 104 mmol/L (ref 98–111)
Creatinine, Ser: 0.95 mg/dL (ref 0.61–1.24)
GFR, Estimated: >60 mL/min (ref >60)
Glucose, Bld: 88 mg/dL (ref 70–99)
Potassium: 3.7 mmol/L (ref 3.5–5.1)
Sodium: 139 mmol/L (ref 135–145)

## 2019-12-29 MED ORDER — LIDOCAINE-EPINEPHRINE 1 %-1:100000 IJ SOLN
20.0000 mL | Freq: Once | INTRAMUSCULAR | Status: DC
  Filled 2019-12-29: qty 1

## 2019-12-29 MED ORDER — TETANUS-DIPHTH-ACELL PERTUSSIS 5-2.5-18.5 LF-MCG/0.5 IM SUSY
0.5000 mL | PREFILLED_SYRINGE | Freq: Once | INTRAMUSCULAR | Status: AC
  Administered 2019-12-29: 20:00:00 0.5 mL via INTRAMUSCULAR
  Filled 2019-12-29: qty 0.5

## 2019-12-29 MED ORDER — FENTANYL CITRATE (PF) 100 MCG/2ML IJ SOLN
50.0000 ug | Freq: Once | INTRAMUSCULAR | Status: DC
  Filled 2019-12-29: qty 2

## 2019-12-29 MED ORDER — IOHEXOL 350 MG/ML SOLN
100.0000 mL | Freq: Once | INTRAVENOUS | Status: AC | PRN
  Administered 2019-12-29: 22:00:00 100 mL via INTRAVENOUS

## 2019-12-29 NOTE — ED Provider Notes (Addendum)
Patient handed off at shift change from Dr. Stevie Kern pending CTA left leg. See his note for full HPI.  In short, is a 29 year old male who presents to the ED due to a laceration to his left lower extremity after a large mirror fell and landed on his leg. He also sustained a laceration to his index finger  Plan from previous provider: If CTA negative, patient may be discharged home with hand surgery follow for finger laceration.   ED Course/Procedures    Results for orders placed or performed during the hospital encounter of 12/29/19 (from the past 24 hour(s))  CBC with Differential     Status: Abnormal   Collection Time: 12/29/19  7:34 PM  Result Value Ref Range   WBC 7.5 4.0 - 10.5 K/uL   RBC 5.34 4.22 - 5.81 MIL/uL   Hemoglobin 16.4 13.0 - 17.0 g/dL   HCT 82.4 23.5 - 36.1 %   MCV 84.5 80.0 - 100.0 fL   MCH 30.7 26.0 - 34.0 pg   MCHC 36.4 (H) 30.0 - 36.0 g/dL   RDW 44.3 15.4 - 00.8 %   Platelets 264 150 - 400 K/uL   nRBC 0.0 0.0 - 0.2 %   Neutrophils Relative % 57 %   Neutro Abs 4.3 1.7 - 7.7 K/uL   Lymphocytes Relative 33 %   Lymphs Abs 2.4 0.7 - 4.0 K/uL   Monocytes Relative 9 %   Monocytes Absolute 0.7 0.1 - 1.0 K/uL   Eosinophils Relative 1 %   Eosinophils Absolute 0.1 0.0 - 0.5 K/uL   Basophils Relative 0 %   Basophils Absolute 0.0 0.0 - 0.1 K/uL   Immature Granulocytes 0 %   Abs Immature Granulocytes 0.02 0.00 - 0.07 K/uL  Basic metabolic panel     Status: None   Collection Time: 12/29/19  7:34 PM  Result Value Ref Range   Sodium 139 135 - 145 mmol/L   Potassium 3.7 3.5 - 5.1 mmol/L   Chloride 104 98 - 111 mmol/L   CO2 25 22 - 32 mmol/L   Glucose, Bld 88 70 - 99 mg/dL   BUN 18 6 - 20 mg/dL   Creatinine, Ser 6.76 0.61 - 1.24 mg/dL   Calcium 9.3 8.9 - 19.5 mg/dL   GFR, Estimated >09 >32 mL/min   Anion gap 10 5 - 15   . Marland Kitchen.Laceration Repair  Date/Time: 12/29/2019 11:05 PM Performed by: Mannie Stabile, PA-C Authorized by: Mannie Stabile, PA-C    Consent:    Consent obtained:  Verbal   Consent given by:  Patient   Risks discussed:  Infection, need for additional repair, pain, poor cosmetic result and poor wound healing   Alternatives discussed:  No treatment and delayed treatment Universal protocol:    Procedure explained and questions answered to patient or proxy's satisfaction: yes     Relevant documents present and verified: yes     Test results available: yes     Imaging studies available: yes     Required blood products, implants, devices, and special equipment available: yes     Site/side marked: yes     Immediately prior to procedure, a time out was called: yes     Patient identity confirmed:  Verbally with patient Anesthesia:    Anesthesia method:  Local infiltration   Local anesthetic:  Lidocaine 1% WITH epi Laceration details:    Location:  Leg   Leg location:  L lower leg   Length (cm):  3  Depth (mm):  3 Pre-procedure details:    Preparation:  Patient was prepped and draped in usual sterile fashion and imaging obtained to evaluate for foreign bodies Exploration:    Limited defect created (wound extended): no     Hemostasis achieved with:  Direct pressure   Imaging obtained: x-ray     Imaging outcome: foreign body not noted     Wound exploration: wound explored through full range of motion and entire depth of wound visualized     Wound extent: no foreign bodies/material noted, no muscle damage noted, no nerve damage noted, no tendon damage noted and no underlying fracture noted     Contaminated: no   Treatment:    Area cleansed with:  Saline   Amount of cleaning:  Extensive   Irrigation solution:  Sterile saline   Irrigation volume:  250   Irrigation method:  Pressure wash   Visualized foreign bodies/material removed: no     Debridement:  None Skin repair:    Repair method:  Sutures   Suture size:  4-0   Suture material:  Prolene   Suture technique:  Simple interrupted   Number of sutures:   4 Approximation:    Approximation:  Close Repair type:    Repair type:  Simple Post-procedure details:    Dressing:  Non-adherent dressing and splint for protection .Marland KitchenLaceration Repair  Date/Time: 12/29/2019 11:07 PM Performed by: Mannie Stabile, PA-C Authorized by: Mannie Stabile, PA-C   Consent:    Consent obtained:  Verbal   Consent given by:  Patient   Risks discussed:  Infection, need for additional repair, pain, poor cosmetic result and poor wound healing   Alternatives discussed:  No treatment and delayed treatment Universal protocol:    Procedure explained and questions answered to patient or proxy's satisfaction: yes     Relevant documents present and verified: yes     Test results available: yes     Imaging studies available: yes     Required blood products, implants, devices, and special equipment available: yes     Site/side marked: yes     Immediately prior to procedure, a time out was called: yes     Patient identity confirmed:  Verbally with patient Anesthesia:    Anesthesia method:  Local infiltration   Local anesthetic:  Lidocaine 1% WITH epi Laceration details:    Location:  Leg   Leg location:  L lower leg   Length (cm):  2   Depth (mm):  2 Pre-procedure details:    Preparation:  Patient was prepped and draped in usual sterile fashion and imaging obtained to evaluate for foreign bodies Exploration:    Limited defect created (wound extended): no     Hemostasis achieved with:  Direct pressure   Imaging obtained: x-ray     Imaging outcome: foreign body not noted     Wound exploration: wound explored through full range of motion and entire depth of wound visualized     Wound extent: no areolar tissue violation noted, no fascia violation noted, no foreign bodies/material noted, no muscle damage noted, no nerve damage noted, no tendon damage noted, no underlying fracture noted and no vascular damage noted     Contaminated: no   Treatment:    Area  cleansed with:  Saline   Amount of cleaning:  Standard   Irrigation solution:  Sterile saline   Irrigation volume:  250   Irrigation method:  Pressure wash   Debridement:  None  Undermining:  None Skin repair:    Repair method:  Sutures   Suture size:  4-0   Suture material:  Prolene   Number of sutures:  3 Approximation:    Approximation:  Close Repair type:    Repair type:  Simple Post-procedure details:    Dressing:  Non-adherent dressing    MDM  Received patient from Dr. Stevie Kern at shift change pending CTA results. See Dr. Buena Irish note for full MDM.  29 year old male presents to the ED due to left lower extremity laceration from large mirror.  Patient has two penetrating wounds to left lower extremity. Laceration repaired by Dr. Stevie Kern. X-ray personally reviewed which demonstrates: IMPRESSION:  Soft tissue swelling without evidence for an acute displaced  fracture or dislocation. No radiopaque foreign bodies.   Tetanus updated here in the ED.  BMP unremarkable normal renal function no major electrolyte derangements.  CBC reassuring with no leukocytosis and normal hemoglobin.  CTA personally reviewed which demonstrates: IMPRESSION:  VASCULAR    1. Several sites of cutaneous defect/possible laceration seen along  the posterior aspect of popliteal fossa the joint line and slightly  more laterally in the proximal lower leg. Small amount of stranding  and gas superficial to the gastrocnemius at the popliteal fossa with  some mild stranding about the proximal extent of the short saphenous  vein, possibly corresponding to the site of a reportedly repaired  vascular injury.  2. No evidence of other acute arteriovenous injury in the lower  extremities.    NON-VASCULAR    1. Additional sites of swelling/likely contusive change in the  prepatellar soft tissues.  2. No acute fracture or traumatic osseous injuries.  3. No retained foreign bodies.   No concern to  arterial injury at this time. Suture repair performed. See procedure notes above. Discussed case with Dr. Donnald Garre who evaluated wound prior to suture repair who agrees with assessment and plan. Patient given hand surgery number at discharge.  Instructed patient to call next week to schedule point for further evaluation of finger laceration.  Instructed patient to have left lower extremity laceration checked during his appointment.  Instructed patient take over-the-counter ibuprofen or Tylenol as needed for pain. Strict ED precautions discussed with patient. Patient states understanding and agrees to plan. Patient discharged home in no acute distress and stable vitals    Jesusita Oka 12/29/19 2239    Mannie Stabile, PA-C 12/29/19 2309    Arby Barrette, MD 12/29/19 2324

## 2019-12-29 NOTE — ED Notes (Signed)
Instructions for care and DC paperwork were reviewed with pt using interpreter services.  Pt acknowledged understanding.  PT DC.

## 2019-12-29 NOTE — ED Triage Notes (Signed)
Pt was on ladder and fell to ground and hit a mirror which broke and cut his leg.

## 2019-12-29 NOTE — Discharge Instructions (Addendum)
As discussed, all of your images were normal. I have included the number of the hand surgeon. Please call Monday to schedule an appointment for further evaluation of your finger injury. Have him check your leg wound at your appointment. You may take over the counter ibuprofen or tylenol as needed for pain. Return to the ER for any new or worsening symptoms.  You will need your sutures removed in 10-14 days.

## 2019-12-29 NOTE — ED Provider Notes (Signed)
[facility] EMERGENCY DEPARTMENT Provider Note   CSN: [id number] Arrival date & time: 12/29/19  1847     History Chief Complaint  Patient presents with   Fall   Laceration    David Delacruz is a 29 y.o. male.  Presents emergency room with concern for laceration.  He reports that just prior to arrival he had a large mirror fall and landed on his left leg.  Unsure what exactly injured his leg, likely glass.  Also suffered a laceration to his index finger.  HPI     No past medical history on file.  There are no problems to display for this patient.   No past surgical history on file.     No family history on file.  Social History   Tobacco Use   Smoking status: Never Smoker   Smokeless tobacco: Never Used  Vaping Use   Vaping Use: Never used  Substance Use Topics   Alcohol use: Yes    Comment: occasionally   Drug use: No    Home Medications Prior to Admission medications   Medication Sig Start Date End Date Taking? Authorizing Provider  acetaminophen (TYLENOL) 500 MG tablet Take 500 mg by mouth every 6 (six) hours as needed for mild pain.    [provider]  cephALEXin (KEFLEX) 500 MG capsule Take 1 capsule (500 mg total) by mouth 4 (four) times daily. 10/31/16   Kirichenko, Lemont Fillers, PA-C  chlorpheniramine-HYDROcodone (TUSSIONEX PENNKINETIC ER) 10-8 MG/5ML LQCR Take 5 mLs by mouth every 12 (twelve) hours. Take 5 mLs by mouth every 12 (twelve) hours. Patient not taking: Reported on 03/07/2019 12/05/12   Ivonne Andrew, PA-C  Dextran 70-Hypromellose (ARTIFICIAL TEARS PF OP) Place 1 drop into both eyes daily.    [provider]  diclofenac Sodium (VOLTAREN) 1 % GEL Apply 2 g topically 4 (four) times daily. Patient not taking: Reported on 03/08/2019 03/07/19   Caccavale, Sophia, PA-C  HYDROcodone-acetaminophen (NORCO/VICODIN) 5-325 MG per tablet Take 1-2 tablets every 6 hours as needed for severe pain 11/13/13   Renne Crigler, PA-C  ibuprofen (ADVIL,MOTRIN) 800 MG tablet Take 1 tablet (800 mg total) by mouth 3 (three) times daily. 12/05/12   Ivonne Andrew, PA-C  naproxen (NAPROSYN) 500 MG tablet Take 1 tablet (500 mg total) by mouth 2 (two) times daily. 11/13/13   Renne Crigler, PA-C    Allergies    Patient has no known allergies.  Review of Systems   Review of Systems  Unable to perform ROS: Acuity of condition    Physical Exam Updated Vital Signs BP 122/80    Pulse 77    Temp 98.7 F (37.1 C)    Resp 16    Ht 5\' 6"  (1.676 m)    Wt 76 kg    SpO2 96%    BMI 27.04 kg/m   Physical Exam Vitals and nursing note reviewed.  Constitutional:      Appearance: He is well-developed and well-nourished.  HENT:     Head: Normocephalic and atraumatic.  Eyes:     Conjunctiva/sclera: Conjunctivae normal.  Cardiovascular:     Rate and Rhythm: Normal rate and regular rhythm.     Heart sounds: No murmur heard.   Pulmonary:     Effort: Pulmonary effort is normal. No respiratory distress.  Abdominal:     Palpations: Abdomen is soft.     Tenderness: There is no abdominal tenderness.  Musculoskeletal:        General: No  edema.     Cervical back: Neck supple.     Comments: LLE: 3cm penetrating wound in posterior knee fossa, small active bleeding noted which stops with direct pressure; additional 1cm penetraing wound/laceration over lateral mid lower leg, no active bleeding; normal dp and pt pulses  LUE: 1cm laceration to lateral mid index finger, normal sensation to light touch intact, flexion/extension intact  Skin:    General: Skin is warm.  Neurological:     Mental Status: He is alert.  Psychiatric:        Mood and Affect: Mood and affect and mood normal.        Behavior: Behavior normal.     ED Results / Procedures / Treatments   Labs (all labs ordered are listed, but only abnormal results are displayed) Labs Reviewed  CBC WITH DIFFERENTIAL/PLATELET - Abnormal; Notable for the following  components:      Result Value   MCHC 36.4 (*)    All other components within normal limits  BASIC METABOLIC PANEL    EKG None  Radiology DG Tibia/Fibula Left  Result Date: 12/29/2019 CLINICAL DATA:  Glass in leg. EXAM: LEFT TIBIA AND FIBULA - 2 VIEW COMPARISON:  None. FINDINGS: There is soft tissue swelling about the left lower extremity without evidence for an acute displaced fracture or dislocation. There are no radiopaque foreign bodies. IMPRESSION: Soft tissue swelling without evidence for an acute displaced fracture or dislocation. No radiopaque foreign bodies. Electronically Signed   By: Katherine Mantle M.D.   On: 12/29/2019 19:20    Procedures .Marland KitchenLaceration Repair  Date/Time: 12/29/2019 9:49 PM Performed by: Milagros Loll, MD Authorized by: Milagros Loll, MD   Consent:    Consent obtained:  Verbal   Consent given by:  Patient   Risks, benefits, and alternatives were discussed: yes     Risks discussed:  Infection, need for additional repair, nerve damage, poor wound healing, poor cosmetic result, pain, retained foreign body, tendon damage and vascular damage   Alternatives discussed:  No treatment and delayed treatment Universal protocol:    Immediately prior to procedure, a time out was called: yes   Laceration details:    Location:  Finger   Finger location:  L index finger   Length (cm):  1 Treatment:    Area cleansed with:  Saline   Amount of cleaning:  Extensive   Irrigation solution:  Sterile saline   Irrigation method:  Syringe Skin repair:    Repair method:  Sutures   Suture size:  5-0   Suture material:  Nylon   Suture technique:  Simple interrupted   Number of sutures:  2 Approximation:    Approximation:  Close Repair type:    Repair type:  Simple Post-procedure details:    Procedure completion:  Tolerated well, no immediate complications .Marland KitchenLaceration Repair  Date/Time: 12/29/2019 9:50 PM Performed by: Milagros Loll,  MD Authorized by: Milagros Loll, MD   Consent:    Consent obtained:  Verbal   Consent given by:  Patient   Risks, benefits, and alternatives were discussed: yes     Risks discussed:  Infection, need for additional repair, nerve damage, poor wound healing, poor cosmetic result, pain, retained foreign body, tendon damage and vascular damage   Alternatives discussed:  No treatment Universal protocol:    Patient identity confirmed:  Verbally with patient Anesthesia:    Anesthesia method:  Local infiltration   Local anesthetic:  Lidocaine 1% WITH epi Laceration details:  Location:  Leg   Leg location:  L knee   Length (cm):  3 Exploration:    Hemostasis achieved with:  Tied off vessels and direct pressure Treatment:    Area cleansed with:  Povidone-iodine and saline   Amount of cleaning:  Extensive   Irrigation solution:  Sterile saline   Irrigation method:  Syringe   Layers/structures repaired:  Deep subcutaneous Deep subcutaneous:    Suture size:  4-0   Suture material:  Vicryl   Suture technique:  Figure eight   Number of sutures:  1 Approximation:    Approximation:  Loose Post-procedure details:    Dressing:  Tube gauze   Procedure completion:  Tolerated well, no immediate complications   (including critical care time)  Medications Ordered in ED Medications  lidocaine-EPINEPHrine (XYLOCAINE W/EPI) 1 %-1:100000 (with pres) injection 20 mL (has no administration in time range)  fentaNYL (SUBLIMAZE) injection 50 mcg (0 mcg Intravenous Hold 12/29/19 1930)  Tdap (BOOSTRIX) injection 0.5 mL (0.5 mLs Intramuscular Given 12/29/19 1931)    ED Course  I have reviewed the triage vital signs and the nursing notes.  Pertinent labs & imaging results that were available during my care of the patient were reviewed by me and considered in my medical decision making (see chart for details).    MDM Rules/Calculators/A&P                         29 year old male presents to ER  with concern for laceration to left leg, trauma from large mirror.  On exam appears to have 2 penetrating wounds to his left leg, 1 over posterior knee and the other over the lateral mid lower leg.  There was active bleeding, suspect small vessel in his left knee laceration.  Placed 1 figure-of-eight suture relatively superficial and bleeding stopped.  Plain film negative.  Given proximity to important vasculature of suspected penetrating wound, will check CTA of his left leg.  Regarding his finger, neurovascularly intact, noted slight tingling sensation to his lateral distal finger though sensation to light touch was intact.  Simple repair, closed at bedside.  Given that tingling sensation, recommended he follow-up with hand surgery for recheck on this injury.  Will leave his suspected penetrating leg wounds open and allow to heal by secondary intention to minimize risk of infection.  While awaiting CT, signed out to [facility].  Final Clinical Impression(s) / ED Diagnoses Final diagnoses:  Laceration of left lower extremity, initial encounter  Laceration of left index finger without foreign body without damage to nail, initial encounter    Rx / DC Orders ED Discharge Orders    None       Milagros Loll, MD 12/29/19 2156

## 2021-09-09 IMAGING — CT CT ANGIO EXTREM LOW*R*
1 of 6 series · 12 of 33 positions shown · IV contrast (OMNI 350)
Comparison: None.

CLINICAL DATA: Fall from ladder striking near which broke and cut
left leg

EXAM:
CT ANGIOGRAPHY OF THE BILATERAL LOWER EXTREMITIES WITH ILIOFEMORAL
RUNOFF.
TECHNIQUE: Multidetector CT imaging of BOTH lower extremities was performed
using the standard protocol during bolus administration of
intravenous contrast. IMAGING EXTENDS FROM THE AORTIC BIFURCATION
multiplanar CT image reconstructions and MIPs were obtained to
evaluate the vascular anatomy.
CONTRAST:  100mL OMNIPAQUE IOHEXOL 350 MG/ML SOLN

[Series 5: cta runoff (id) · axial · 0.92mm/px · z∈[+50,+1064]mm · 12 of 400 slices shown]
[im 31/400  soft-tissue]
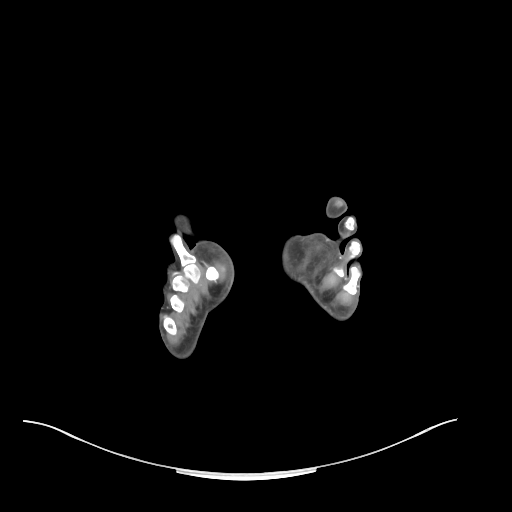
[im 62/400  bone]
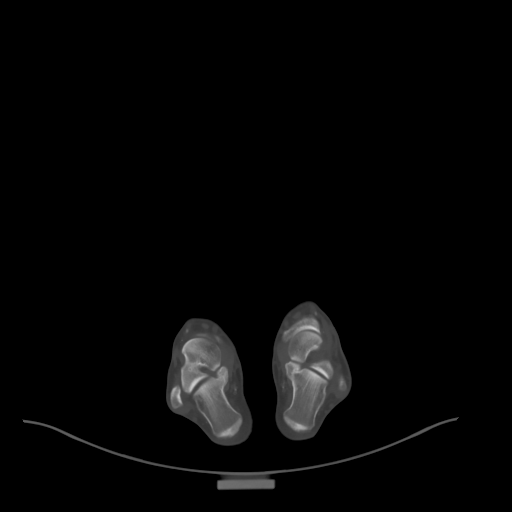
[im 93/400  soft-tissue]
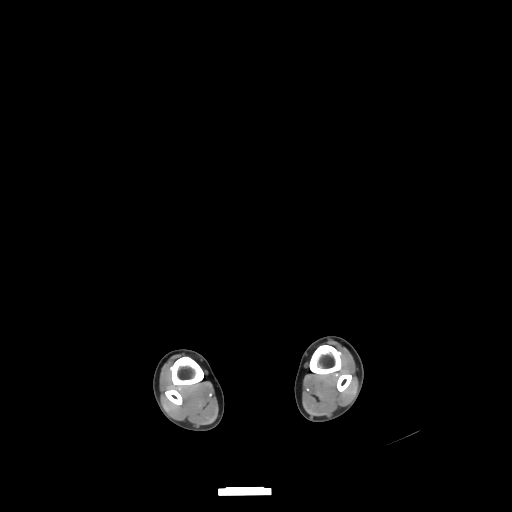
[im 123/400  bone]
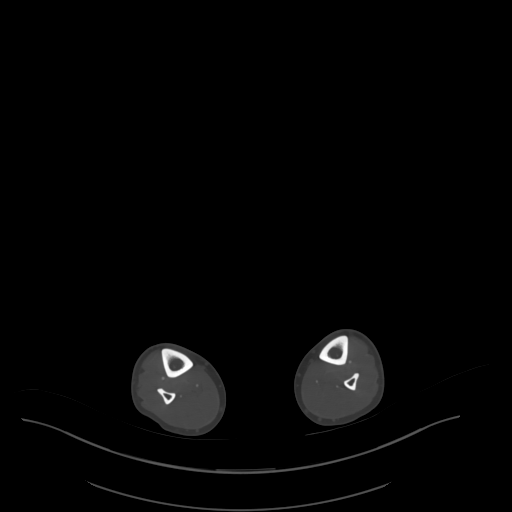
[im 154/400  soft-tissue]
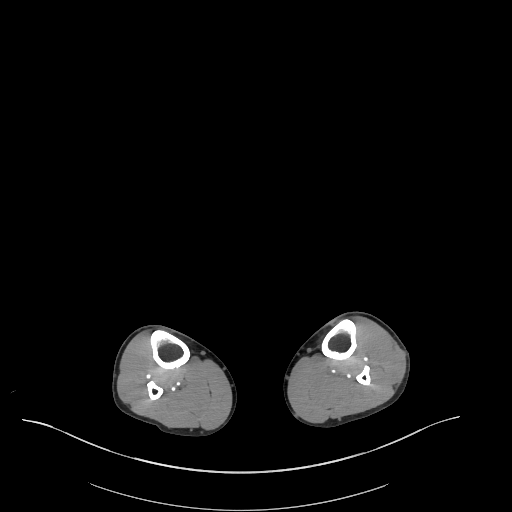
[im 185/400  bone]
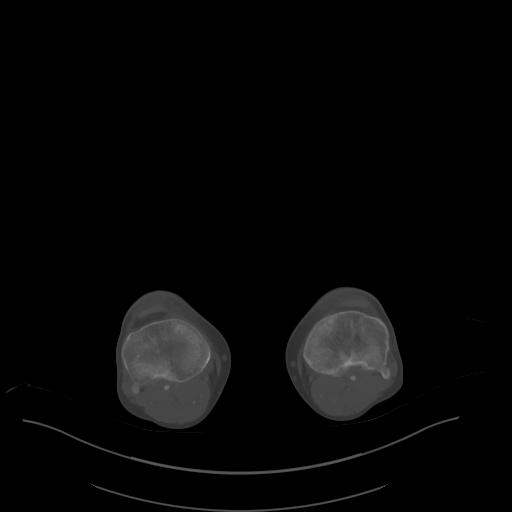
[im 215/400  soft-tissue]
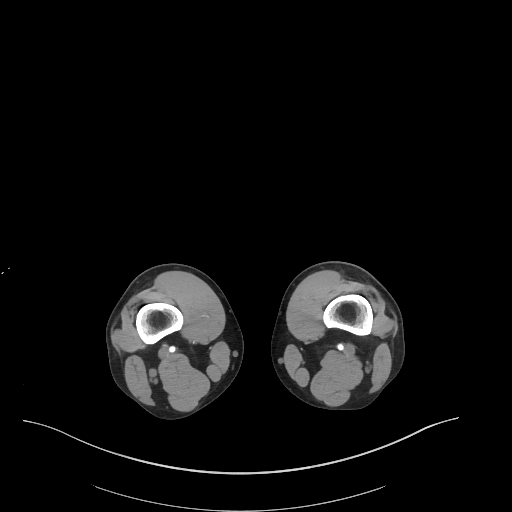
[im 246/400  bone]
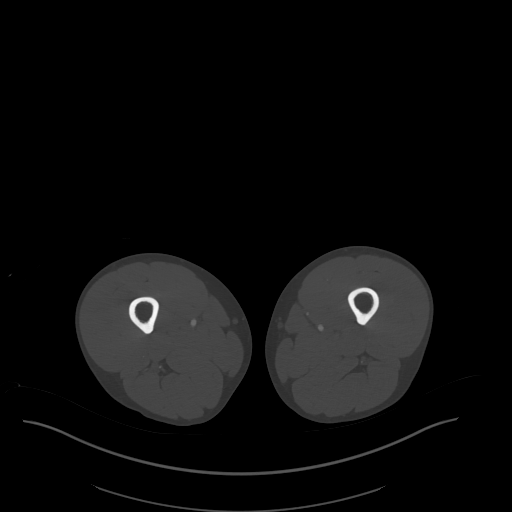
[im 277/400  soft-tissue]
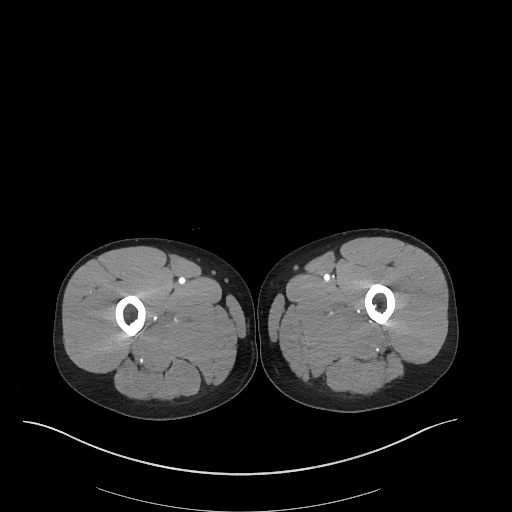
[im 307/400  bone]
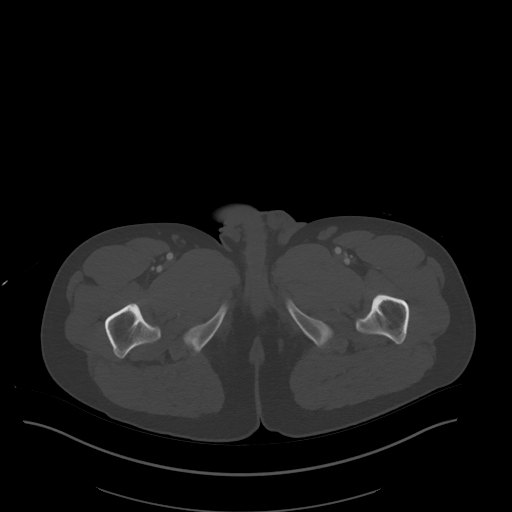
[im 338/400  soft-tissue]
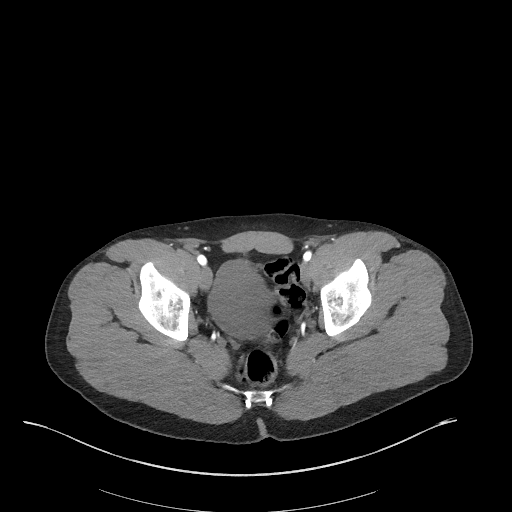
[im 369/400  bone]
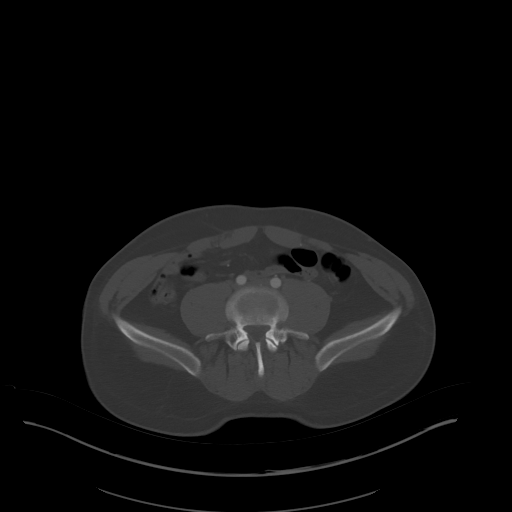

[12 of 33 positions shown; findings below may reference images not displayed]

FINDINGS: VASCULAR

Aorta: Distal abdominal aorta to the level of the bifurcation is
widely patent without acute luminal abnormality, significant
atherosclerotic disease, narrowing or stenosis. Partially visualized
branches of the SMA are widely patent without acute abnormality.
Ostium and included portions of the IMA are widely patent without
evidence of aneurysm, dissection or vasculitis.

RIGHT Lower Extremity

Inflow: Common, internal and external iliac arteries are patent
without evidence of aneurysm, dissection, vasculitis or significant
stenosis.

Outflow: Common, superficial and profunda femoral arteries and the
popliteal artery are patent without evidence of aneurysm,
dissection, vasculitis or significant stenosis.

Runoff: Patent three vessel runoff to the ankle.

LEFT Lower Extremity

Inflow: Common, internal and external iliac arteries are patent
without evidence of aneurysm, dissection, vasculitis or significant
stenosis.

Outflow: Common, superficial and profunda femoral arteries and the
popliteal artery are patent without evidence of aneurysm,
dissection, vasculitis or significant stenosis. No evidence of acute
traumatic vascular injury is seen at the level of the left popliteal
fossa near the sites of direct penetrating injury along the lateral
aspect of the popliteal fossa nor the more lateral cutaneous injury
in the proximal lower leg.

Runoff: Patent three vessel runoff to the ankle.

Veins: Small amount of gas is seen next to the is short saphenous at
the level of the proximal calf near the site of penetrating injury
but without convincing evidence of direct traumatic injury. No other
acute or significant venous abnormalities are identified within the
limitations of this arterial phase exam.

Review of the MIP images confirms the above findings.

NON-VASCULAR

Urinary Tract: Inferior poles of the kidneys are unremarkable.
Visible portions of the ureters are free of acute abnormality.
Urinary bladder is largely decompressed at the time of exam and
therefore poorly evaluated by CT imaging. No gross bladder
abnormality is seen.

Bowel: No visible large or small bowel thickening or dilatation. No
evidence of obstruction. Normal appendix in the right lower
quadrant. Or mesenteric injury, contusion or hematoma.

Lymphatic: No suspicious or enlarged lymph nodes in the included
lymphatic chains.

Reproductive: The prostate and seminal vesicles are unremarkable. No
acute traumatic abnormality of the external genitalia. Trace
paratesticular fluid is likely within physiologic normal.

Other: No free pelvic fluid or air. No traumatic abdominal wall
dehiscence. No visible body wall hematoma or retroperitoneal
hemorrhage.

Musculoskeletal: There are sites of cutaneous laceration in the left
lower extremity. The first is seen along the posterolateral aspect
of the popliteal fossa near the joint line (400/208). Stranding and
gas is seen quite superficial with at most minimal stranding present
in the lateral gastrocnemius and posterior compartment. No large
intramuscular hemorrhage. No retained foreign body is seen.

Additional site of laceration is seen more laterally in the proximal
lower leg (5/245) stranding and thickening is quite superficial
across the anterior compartment without large intramuscular hematoma
or significant soft tissue gas. No retained foreign body at this
level.

Bilateral soft tissue thickening and stranding is noted anterior to
the patella tendons with trace bilateral effusions. Correlate for
swelling/contusive change. No acute fracture at this level or
elsewhere within the included margins of imaging. Alignment of the
hips, knees and ankles are maintained.
IMPRESSION: VASCULAR

1. Several sites of cutaneous defect/possible laceration seen along
the posterior aspect of popliteal fossa the joint line and slightly
more laterally in the proximal lower leg. Small amount of stranding
and gas superficial to the gastrocnemius at the popliteal fossa with
some mild stranding about the proximal extent of the short saphenous
vein, possibly corresponding to the site of a reportedly repaired
vascular injury.
2. No evidence of other acute arteriovenous injury in the lower
extremities.

NON-VASCULAR

1. Additional sites of swelling/likely contusive change in the
prepatellar soft tissues.
2. No acute fracture or traumatic osseous injuries.
3. No retained foreign bodies.

These results were called by telephone at the time of interpretation
on 12/29/2019 at [DATE] to provider John Marvi, who verbally
acknowledged these results.

## 2024-01-28 ENCOUNTER — Other Ambulatory Visit: Payer: Self-pay

## 2024-01-28 ENCOUNTER — Emergency Department (HOSPITAL_COMMUNITY)
Admission: EM | Admit: 2024-01-28 | Discharge: 2024-01-29 | Disposition: A | Payer: Self-pay | Attending: Emergency Medicine | Admitting: Emergency Medicine

## 2024-01-28 ENCOUNTER — Encounter (HOSPITAL_COMMUNITY): Payer: Self-pay

## 2024-01-28 DIAGNOSIS — H60502 Unspecified acute noninfective otitis externa, left ear: Secondary | ICD-10-CM | POA: Insufficient documentation

## 2024-01-28 NOTE — ED Triage Notes (Signed)
 Pt reports L ear pain radiating to cheek an upper jaw x2 days.

## 2024-01-29 MED ORDER — CIPROFLOXACIN-DEXAMETHASONE 0.3-0.1 % OT SUSP: 4.0000 [drp] | Freq: Two times a day (BID) | OTIC | 0 refills | Status: AC

## 2024-01-29 NOTE — Discharge Instructions (Addendum)
 ### Instrucciones de Alta / Discharge Instructions - Otitis Externa     ## INSTRUCCIONES DE ALTA - INFECCIN DEL ODO (OTITIS EXTERNA)        ## DISCHARGE INSTRUCTIONS - EAR INFECTION (OTITIS EXTERNA)        ---      **Diagnstico / Diagnosis:** Otitis externa (infeccin del canal del odo / ear canal infection)      **Medicamento recetado / Prescribed medication:** Ciprodex  (ciprofloxacina y dexametasona) gotas para el odo / ear drops      ---      ### CMO USAR LAS GOTAS PARA EL ODO / HOW TO USE THE EAR DROPS        **Dosis / Dose:** 4 gotas en el odo afectado, 2 veces al da por 7 das / 4 drops in the affected ear, 2 times daily for 7 days      **Instrucciones paso a paso / Step-by-step instructions:**      1. **Agite bien el frasco** antes de cada uso / **Shake the bottle well** before each use      2. **Caliente el frasco** sostenindolo en su mano por 1-2 minutos. Esto previene mareos. / **Warm the bottle** by holding it in your hand for 1-2 minutes. This prevents dizziness.      3. **Acustese de lado** con el odo afectado hacia arriba / **Lie down on your side** with the affected ear facing up      4. **Ponga 4 gotas** en el odo afectado / **Put 4 drops** into the affected ear      5. **Permanezca acostado** en la misma posicin por 60 segundos (1 minuto) para que las gotas penetren / **Stay lying down** in the same position for 60 seconds (1 minute) so the drops can penetrate      6. Si ambos odos estn afectados, repita para el otro odo / If both ears are affected, repeat for the other ear      7. **Use todas las gotas por 7 das completos**, incluso si se siente mejor antes / **Use all the drops for the full 7 days**, even if you feel better sooner      8. **Deseche** (tire) el frasco despus de advertising account planner / **Discard** (throw away) the bottle after completing treatment      ---      ### IMPORTANTE / IMPORTANT        **SOLO para los  odos** - No use en los ojos ni se lo trague / **For ears ONLY** - Do not use in eyes or swallow      **No se meta nada en el odo** - No use hisopos (Q-tips), dedos, u otros objetos en el odo / **Do not put anything in the ear** - Do not use cotton swabs (Q-tips), fingers, or other objects in the ear      **Mantenga el odo seco** durante el tratamiento: / **Keep the ear dry** during treatment:      - Use tapones para los odos o algodn con vaselina al baarse / Use earplugs or cotton with petroleum jelly when showering      - **Evite nadar** por 7-10 das / **Avoid swimming** for 7-10 days      - No sumerja la cabeza bajo el agua / Do not submerge your head underwater      ---      ### QU ESPERAR / WHAT TO EXPECT        **Mejora esperada / Expected  improvement:**      - Debe sentirse mejor en 2-3 das (48-72 horas) / You should feel better within 2-3 days (48-72 hours)      - Los sntomas deben desaparecer casi completamente en 7 das / Symptoms should be almost completely gone by 7 days      **Manejo del dolor / Pain management:**      - Puede tomar acetaminofn (Tylenol ) o ibuprofeno (Advil , Motrin ) para el dolor segn las indicaciones / You may take acetaminophen  (Tylenol ) or ibuprofen  (Advil , Motrin ) for pain as directed      - El dolor es ms fuerte en los primeros 2-3 das / Pain is strongest in the first 2-3 days      ---      ### CUNDO LLAMAR AL MDICO / WHEN TO CALL THE DOCTOR        **Llame a su mdico si / Call your doctor if:**      - El dolor no mejora despus de 2-3 das de autozone / Pain does not improve after 2-3 days of using the drops      - Los sntomas empeoran / Symptoms get worse      - Desarrolla fiebre / You develop fever      - Nota enrojecimiento o hinchazn que se extiende ms all del odo / You notice redness or swelling spreading beyond the ear      - Tiene reaccin alrgica (sarpullido, picazn, hinchazn de la cara) / You  have an allergic reaction (rash, itching, swelling of the face)      ---      ### PREVENCIN FUTURA / FUTURE PREVENTION        - Mantenga los odos secos despus de nadar o baarse / Keep ears dry after swimming or bathing      - No se meta objetos en los odos / Do not put objects in your ears      - Puede usar un secador de pelo en la temperatura ms baja para secar los odos despus de nadar / You may use a hair dryer on the lowest setting to dry ears after swimming      ---      **Preguntas / Questions:** Llame a su mdico o clnica / Call your doctor or clinic      **Emergencia / Emergency:** Vaya a la sala de emergencias o llame al 911 / Go to the emergency room or call 911

## 2024-01-29 NOTE — ED Provider Notes (Signed)
 " [facility] Provider Note   CSN: [id number] Arrival date & time: 01/28/24  2049     Patient presents with: Otalgia   David Delacruz is a 34 y.o. male.  Patient presents left-sided external ear pain which began 2 days ago.  He states that the pain radiates to his upper left jaw.  He denies discharge from the ear, fever.    Otalgia      Prior to Admission medications  Medication Sig Start Date End Date Taking? Authorizing Provider  ciprofloxacin -dexamethasone  (CIPRODEX ) OTIC suspension Place 4 drops into the left ear 2 (two) times daily for 7 days. 01/29/24 02/05/24 Yes Logan Ubaldo NOVAK, PA-C  acetaminophen  (TYLENOL ) 500 MG tablet Take 500 mg by mouth every 6 (six) hours as needed for mild pain.    [provider]  cephALEXin  (KEFLEX ) 500 MG capsule Take 1 capsule (500 mg total) by mouth 4 (four) times daily. 10/31/16   Kirichenko, Tatyana, PA-C  chlorpheniramine-HYDROcodone  (TUSSIONEX PENNKINETIC ER) 10-8 MG/5ML LQCR Take 5 mLs by mouth every 12 (twelve) hours. Take 5 mLs by mouth every 12 (twelve) hours. Patient not taking: Reported on 03/07/2019 12/05/12   Dammen, Peter, PA-C  Dextran 70-Hypromellose (ARTIFICIAL TEARS PF OP) Place 1 drop into both eyes daily.    [provider]  diclofenac  Sodium (VOLTAREN ) 1 % GEL Apply 2 g topically 4 (four) times daily. Patient not taking: Reported on 03/08/2019 03/07/19   Caccavale, Sophia, PA-C  HYDROcodone -acetaminophen  (NORCO/VICODIN) 5-325 MG per tablet Take 1-2 tablets every 6 hours as needed for severe pain 11/13/13   Geiple, Joshua, PA-C  ibuprofen  (ADVIL ,MOTRIN ) 800 MG tablet Take 1 tablet (800 mg total) by mouth 3 (three) times daily. 12/05/12   Dammen, Peter, PA-C  naproxen  (NAPROSYN ) 500 MG tablet Take 1 tablet (500 mg total) by mouth 2 (two) times daily. 11/13/13   Geiple, Joshua, PA-C    Allergies: Patient has no known allergies.    Review of Systems  HENT:   Positive for ear pain.     Updated Vital Signs BP 108/66 (BP Location: Right Arm)   Pulse 90   Temp 97.7 F (36.5 C)   Resp 16   Ht 5' 6 (1.676 m)   Wt 76 kg   SpO2 97%   BMI 27.04 kg/m   Physical Exam HENT:     Head: Normocephalic and atraumatic.     Left Ear: Tympanic membrane and ear canal normal.     Ears:     Comments: External left ear tender to palpation Eyes:     Pupils: Pupils are equal, round, and reactive to light.  Pulmonary:     Effort: Pulmonary effort is normal. No respiratory distress.  Musculoskeletal:        General: No signs of injury.     Cervical back: Normal range of motion.  Skin:    General: Skin is dry.  Neurological:     Mental Status: He is alert.  Psychiatric:        Speech: Speech normal.        Behavior: Behavior normal.     (all labs ordered are listed, but only abnormal results are displayed) Labs Reviewed - No data to display  EKG: None  Radiology: No results found.   Procedures   Medications Ordered in the ED - No data to display  Medical Decision Making Risk Prescription drug management.   Patient presents with left sided ear pain. Differential includes otitis externa, otitis media, others  Physical exam consistent with otitis externa with tenderness to external ear.  TM appears grossly normal.  Patient to be discharged home with prescription for Ciprodex .  No indication for further emergent workup or admission.     Final diagnoses:  Acute otitis externa of left ear, unspecified type    ED Discharge Orders          Ordered    ciprofloxacin -dexamethasone  (CIPRODEX ) OTIC suspension  2 times daily        01/29/24 0028               Logan Ubaldo NOVAK, PA-C 01/29/24 0038  "
